# Patient Record
Sex: Female | Born: 1940 | Race: White | Hispanic: No | Marital: Married | Smoking: Never smoker
Health system: Southern US, Community
[De-identification: ages and names within clinical notes are randomized; demographics above are authoritative.]

## PROBLEM LIST (undated history)

## (undated) DIAGNOSIS — S82892A Other fracture of left lower leg, initial encounter for closed fracture: Secondary | ICD-10-CM

## (undated) DIAGNOSIS — E785 Hyperlipidemia, unspecified: Secondary | ICD-10-CM

## (undated) DIAGNOSIS — Z860100 Personal history of colon polyps, unspecified: Secondary | ICD-10-CM

## (undated) DIAGNOSIS — J189 Pneumonia, unspecified organism: Secondary | ICD-10-CM

## (undated) DIAGNOSIS — M1990 Unspecified osteoarthritis, unspecified site: Secondary | ICD-10-CM

## (undated) DIAGNOSIS — E039 Hypothyroidism, unspecified: Secondary | ICD-10-CM

## (undated) DIAGNOSIS — IMO0002 Reserved for concepts with insufficient information to code with codable children: Secondary | ICD-10-CM

## (undated) DIAGNOSIS — C801 Malignant (primary) neoplasm, unspecified: Secondary | ICD-10-CM

## (undated) DIAGNOSIS — N816 Rectocele: Secondary | ICD-10-CM

---
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
## 2000-09-21 ENCOUNTER — Ambulatory Visit (HOSPITAL_COMMUNITY): Admission: RE | Admit: 2000-09-21 | Discharge: 2000-09-21 | Payer: Self-pay | Admitting: Gastroenterology

## 2000-09-21 ENCOUNTER — Encounter (INDEPENDENT_AMBULATORY_CARE_PROVIDER_SITE_OTHER): Payer: Self-pay | Admitting: Specialist

## 2000-09-21 HISTORY — PX: COLONOSCOPY W/ POLYPECTOMY: SHX1380

## 2003-06-19 ENCOUNTER — Other Ambulatory Visit: Admission: RE | Admit: 2003-06-19 | Discharge: 2003-06-19 | Payer: Self-pay | Admitting: Family Medicine

## 2003-06-21 ENCOUNTER — Encounter (INDEPENDENT_AMBULATORY_CARE_PROVIDER_SITE_OTHER): Payer: Self-pay | Admitting: *Deleted

## 2003-06-21 ENCOUNTER — Ambulatory Visit (HOSPITAL_COMMUNITY): Admission: RE | Admit: 2003-06-21 | Discharge: 2003-06-21 | Payer: Self-pay | Admitting: Ophthalmology

## 2004-06-26 ENCOUNTER — Other Ambulatory Visit: Admission: RE | Admit: 2004-06-26 | Discharge: 2004-06-26 | Payer: Self-pay | Admitting: Family Medicine

## 2005-03-06 IMAGING — CR DG CHEST 2V
2 series · 2 of 2 positions shown · non-contrast
Comparison: none

CLINICAL DATA: Patient for vitrectomy.  Patient is a nonsmoker and has no chest complaints.  For eye surgery.   
 TWO VIEW CHEST
 PA and lateral views of the chest show the heart, lungs, bony thorax and soft tissues to be within the limits of normal.  There are no previous films for comparison. 
 IMPRESSION
 No evidence of active disease in the chest.

[view not recorded (1 of 2)]
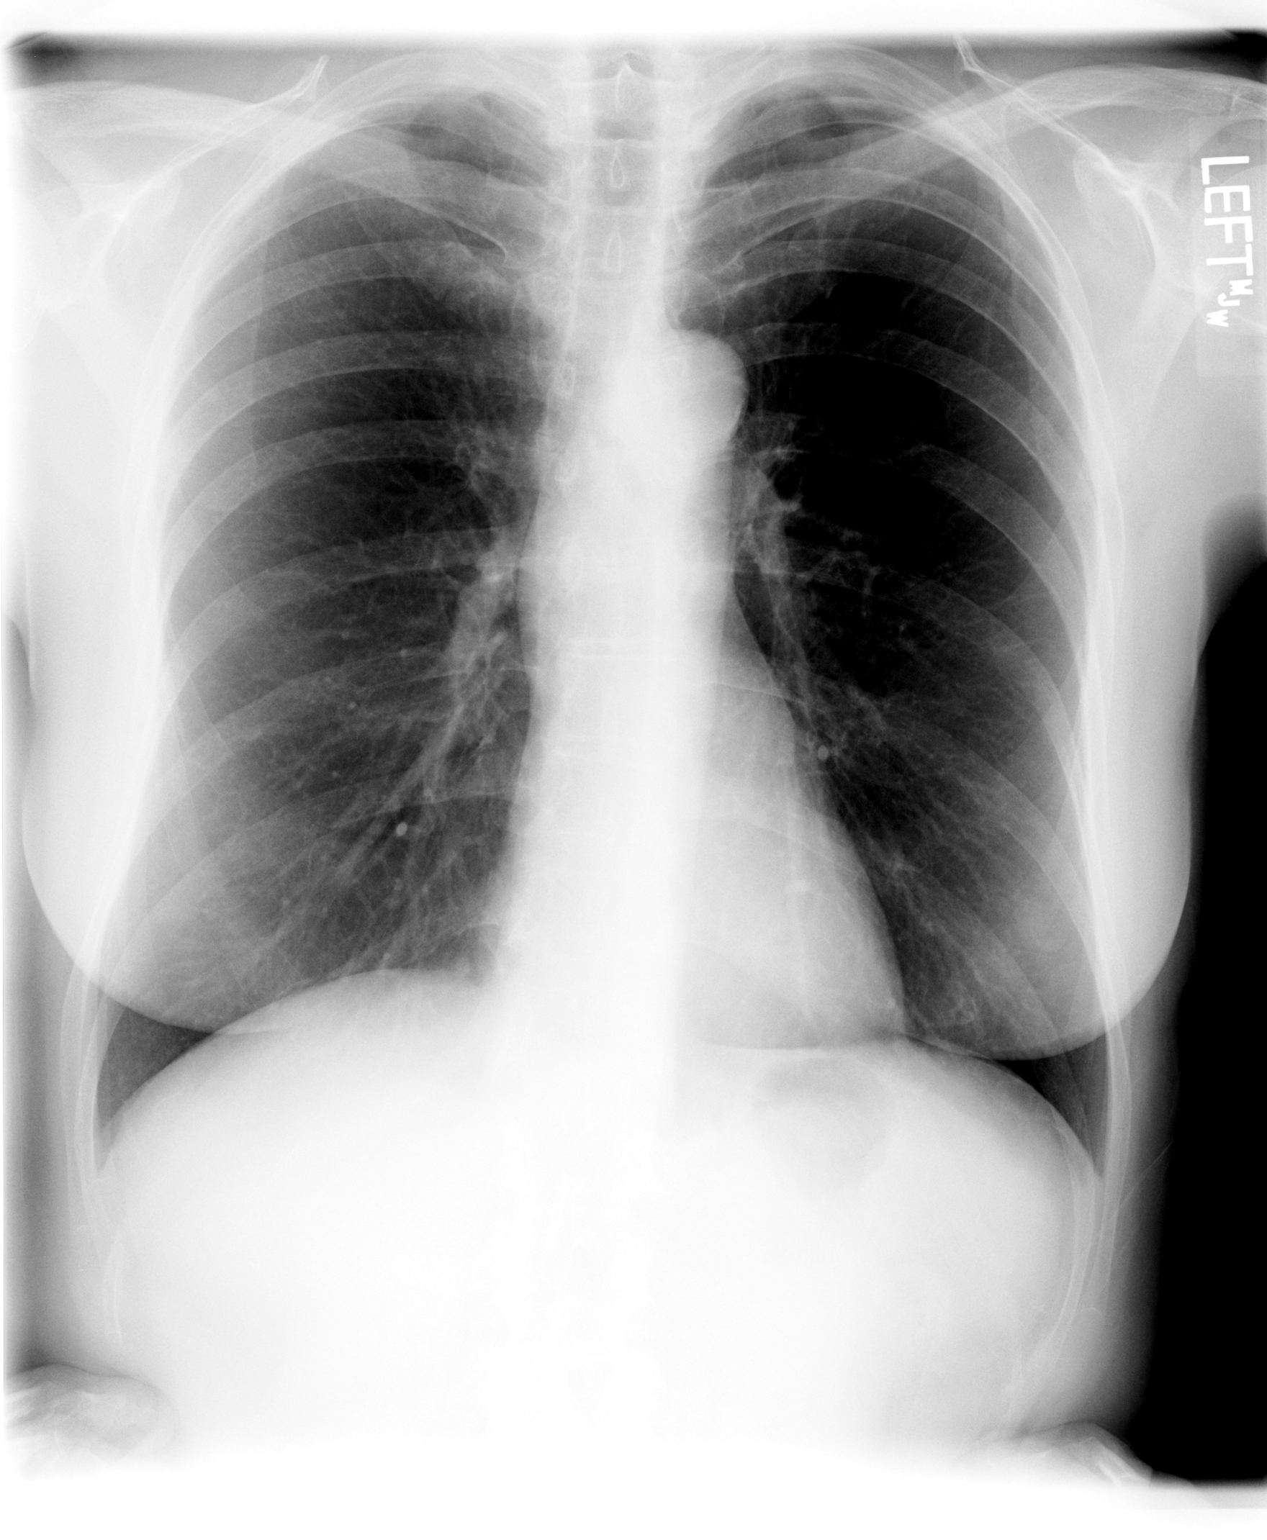

[view not recorded (2 of 2)]
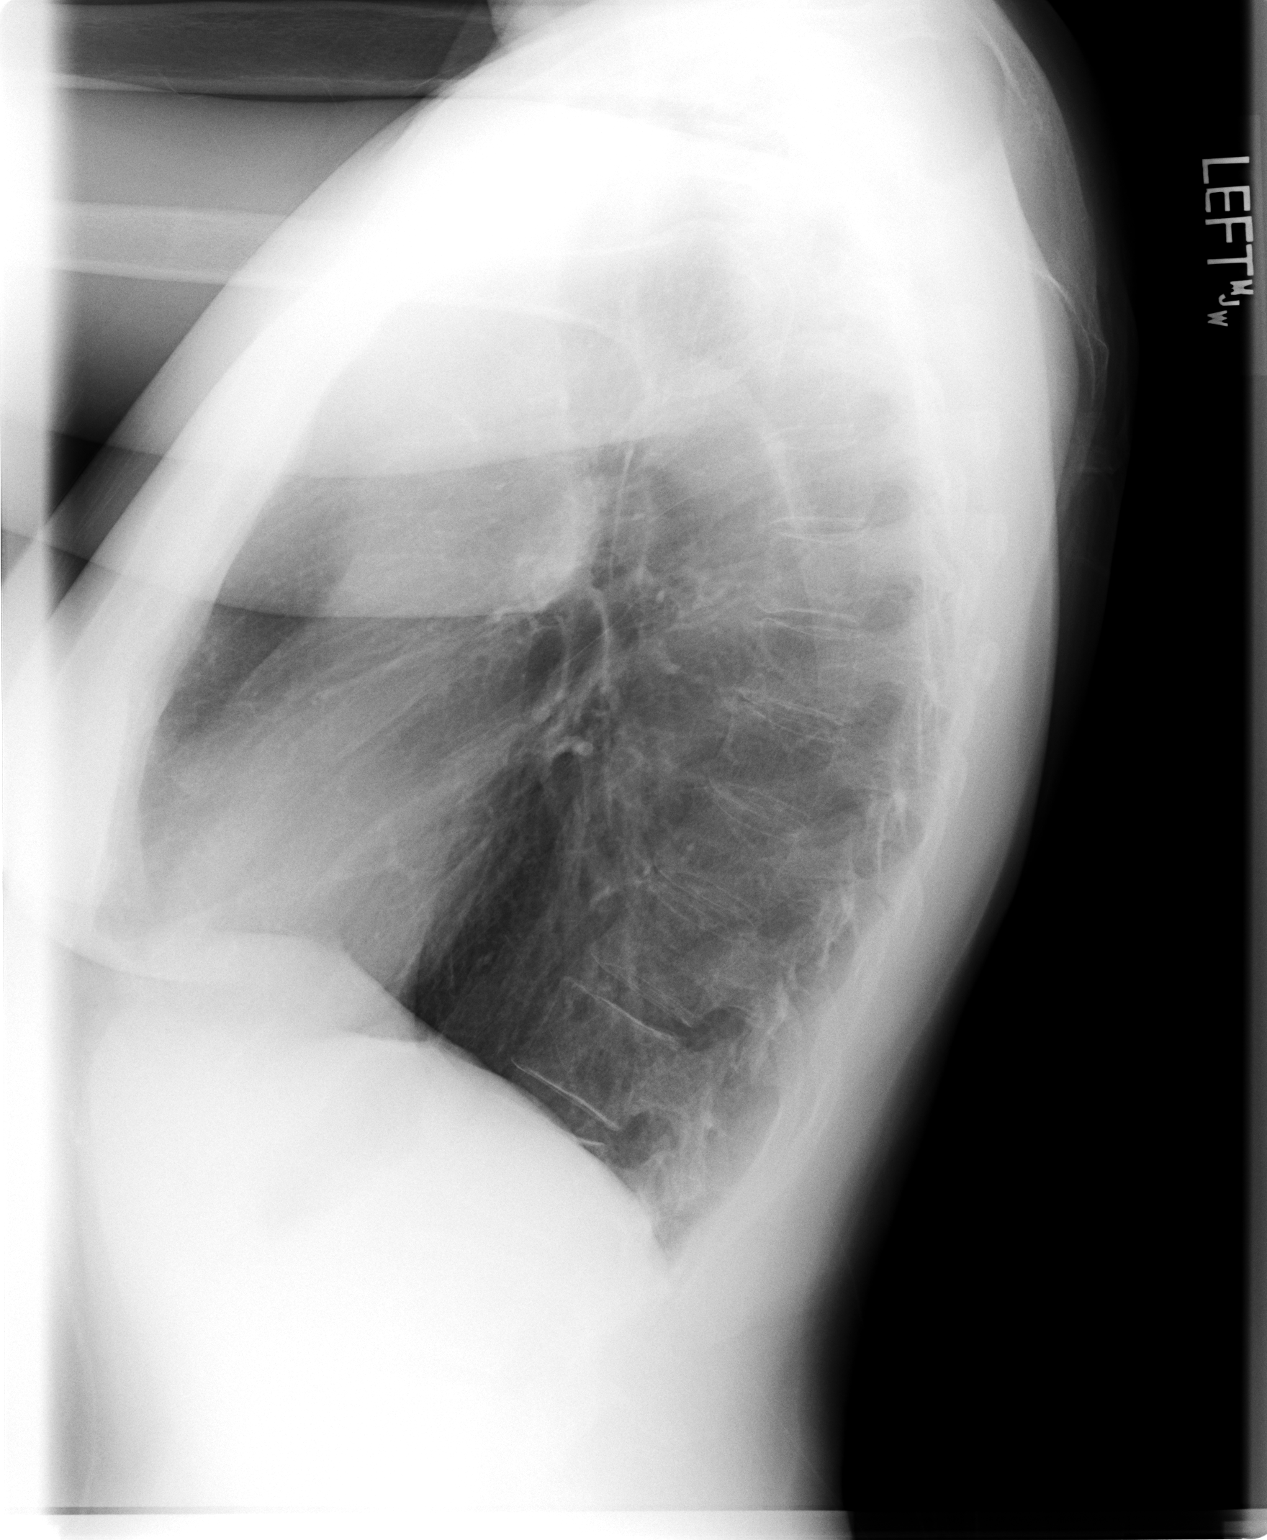

[2 of 2 positions shown; findings below may reference images not displayed]

## 2005-07-07 ENCOUNTER — Other Ambulatory Visit: Admission: RE | Admit: 2005-07-07 | Discharge: 2005-07-07 | Payer: Self-pay | Admitting: Family Medicine

## 2006-07-16 ENCOUNTER — Other Ambulatory Visit: Admission: RE | Admit: 2006-07-16 | Discharge: 2006-07-16 | Payer: Self-pay | Admitting: Family Medicine

## 2007-04-19 ENCOUNTER — Inpatient Hospital Stay (HOSPITAL_COMMUNITY): Admission: RE | Admit: 2007-04-19 | Discharge: 2007-04-22 | Payer: Self-pay | Admitting: Orthopedic Surgery

## 2007-09-20 ENCOUNTER — Other Ambulatory Visit: Admission: RE | Admit: 2007-09-20 | Discharge: 2007-09-20 | Payer: Self-pay | Admitting: Family Medicine

## 2009-01-23 ENCOUNTER — Encounter (INDEPENDENT_AMBULATORY_CARE_PROVIDER_SITE_OTHER): Payer: Self-pay | Admitting: Obstetrics and Gynecology

## 2009-01-23 ENCOUNTER — Ambulatory Visit (HOSPITAL_COMMUNITY): Admission: RE | Admit: 2009-01-23 | Discharge: 2009-01-24 | Payer: Self-pay | Admitting: Obstetrics and Gynecology

## 2009-10-14 ENCOUNTER — Encounter: Admission: RE | Admit: 2009-10-14 | Discharge: 2009-10-14 | Payer: Self-pay | Admitting: Radiology

## 2009-10-22 ENCOUNTER — Ambulatory Visit (HOSPITAL_COMMUNITY): Admission: RE | Admit: 2009-10-22 | Discharge: 2009-10-22 | Payer: Self-pay | Admitting: Surgery

## 2009-10-30 ENCOUNTER — Ambulatory Visit: Payer: Self-pay | Admitting: Oncology

## 2009-11-07 ENCOUNTER — Ambulatory Visit: Admission: RE | Admit: 2009-11-07 | Discharge: 2010-01-10 | Payer: Self-pay | Admitting: Radiation Oncology

## 2010-05-31 LAB — COMPREHENSIVE METABOLIC PANEL
AST: 24 U/L (ref 0–37)
Albumin: 4.1 g/dL (ref 3.5–5.2)
Calcium: 9.3 mg/dL (ref 8.4–10.5)
Chloride: 103 mEq/L (ref 96–112)
Creatinine, Ser: 0.71 mg/dL (ref 0.4–1.2)
GFR calc Af Amer: >60 mL/min (ref >60)
Total Protein: 7.6 g/dL (ref 6.0–8.3)

## 2010-05-31 LAB — DIFFERENTIAL
Eosinophils Relative: 0 % (ref 0–5)
Lymphocytes Relative: 29 % (ref 12–46)
Lymphs Abs: 1.5 10*3/uL (ref 0.7–4.0)
Monocytes Absolute: 0.3 10*3/uL (ref 0.1–1.0)
Monocytes Relative: 6 % (ref 3–12)

## 2010-05-31 LAB — CBC
MCH: 32.4 pg (ref 26.0–34.0)
MCHC: 33.7 g/dL (ref 30.0–36.0)
Platelets: 222 10*3/uL (ref 150–400)
RBC: 3.95 MIL/uL (ref 3.87–5.11)
RDW: 12.3 % (ref 11.5–15.5)

## 2010-05-31 LAB — URINALYSIS, ROUTINE W REFLEX MICROSCOPIC
Bilirubin Urine: NEGATIVE
Glucose, UA: NEGATIVE mg/dL
Ketones, ur: 15 mg/dL — AB
Specific Gravity, Urine: 1.019 (ref 1.005–1.030)
pH: 6.5 (ref 5.0–8.0)

## 2010-05-31 LAB — URINE MICROSCOPIC-ADD ON

## 2010-05-31 LAB — SURGICAL PCR SCREEN
MRSA, PCR: NEGATIVE
Staphylococcus aureus: NEGATIVE

## 2010-06-19 LAB — CBC
HCT: 24.4 % — ABNORMAL LOW (ref 36.0–46.0)
HCT: 35.7 % — ABNORMAL LOW (ref 36.0–46.0)
MCV: 97.9 fL (ref 78.0–100.0)
Platelets: 189 10*3/uL (ref 150–400)
Platelets: 218 10*3/uL (ref 150–400)
RDW: 12.8 % (ref 11.5–15.5)
RDW: 12.9 % (ref 11.5–15.5)
WBC: 4.5 10*3/uL (ref 4.0–10.5)
WBC: 9.3 10*3/uL (ref 4.0–10.5)

## 2010-06-19 LAB — BASIC METABOLIC PANEL
BUN: 12 mg/dL (ref 6–23)
Chloride: 104 mEq/L (ref 96–112)
Creatinine, Ser: 0.6 mg/dL (ref 0.4–1.2)
Glucose, Bld: 90 mg/dL (ref 70–99)

## 2010-07-30 NOTE — Discharge Summary (Signed)
Marilyn Crosby, Marilyn Crosby              ACCOUNT NO.:  [id number]   MEDICAL RECORD NO.:  [id number]          PATIENT TYPE:  INP   LOCATION:  1612                         FACILITY:  [facility]   PHYSICIAN:  Ollen Gross, M.D.    DATE OF BIRTH:  [birth year]   DATE OF ADMISSION:  04/19/2007  DATE OF DISCHARGE:  04/22/2007                               DISCHARGE SUMMARY   ADMITTING DIAGNOSES:  1. Bilateral knee osteoarthritis.  2. Left eye history of macular hemorrhage.  3. Left eye legally blind.  4. Hypothyroidism.  5. Osteoporosis.  6. Postmenopausal.   DISCHARGE DIAGNOSES:  1. Osteoarthritis of left knee, status post left total knee      arthroplasty.  2. Acute blood loss anemia.  Did not require transfusion.  3. Left eye history of macular hemorrhage.  4. Left eye legally blind.  5. Hypothyroidism.  6. Osteoporosis.  7. Postmenopausal.   PROCEDURE:  On April 19, 2007, left total knee surgery by Dr. Lequita Crosby,  assistant Marilyn Peace, PA-C.  Spinal anesthesia.  Consults none.   HISTORY OF PRESENT ILLNESS:  Marilyn Crosby is a 70 year old female with end-  stage arthritis of the left knee, worsening pain and dysfunction, failed  medical management and now presents for total knee.   LABORATORY DATA:  Preop CBC with hemoglobin 12.6, hematocrit 36.8, white  cell count 3.7, platelets 258.  PT/PTT preop 13.6 and 1.0, PTT of 35.  Preop UA only trace leukocyte esterase with 0-2 wbc's, 0-2 rbc's and  rare bacteria.  Chemistry panel all within normal limits.  Serial BMETs  were followed.  Hemoglobin did drop to 10.3, then 8.2, last noted 8.0  and 22.9.  Serial protimes followed with PT/INR 18.9 and 1.5.  Serial  BMETs with electrolytes remaining within normal limits.   EKG on April 02, 2007, in sinus rhythm.   HOSPITAL COURSE:  The patient was admitted to [facility] and  tolerated the procedure well and later transferred to the recovery room  and orthopedic floor.  She was started  on PC and p.o. analgesics.  She  had a spinal with Duramorph and had a little bit of itching, but that  did improve after the Duramorph wore off.  Medications were restarted.  Hemoglobin was 10 postop.  She started getting up out of bed.  By day  #2, she was doing a little bit better, but she did have some  intermittent nausea from the evening of day #1, to the morning of day  #2.  She was tolerating the CPM well.  Hemoglobin was down to 8.2 and  felt to be a little bit of a dilutional component.  She denied any  lightheadedness or dizziness.  She actually did well with her therapy  with low hemoglobin and walked about 140 feet.  Dressing was changed and  incision looked good.  By day #3, hemoglobin is stabilized at 8.0.  She  was asymptomatic with this, progressing well with therapy and wanted to  go home.   DISPOSITION:  The patient was discharged to home on April 22, 2007.   DISCHARGE MEDICATIONS:  Percocet, Robaxin, Nu-Iron and Coumadin.   DIET:  Resume home diet.   ACTIVITY:  She is weightbearing as tolerated on the left lower  extremity.  Home health PT with home health nurse.  Total knee protocol.   FOLLOW UP:  Follow up in 2 weeks.   DISPOSITION:  Home.   CONDITION ON DISCHARGE:  Improved.      Marilyn Crosby, P.A.C.      Ollen Gross, M.D.  Electronically Signed    ALP/MEDQ  D:  04/22/2007  T:  04/23/2007  Job:  [id number]

## 2010-07-30 NOTE — H&P (Signed)
NAMEASTRA, Marilyn Crosby NO.:  [id number]   MEDICAL RECORD NO.:  [id number]          PATIENT TYPE:  INP   LOCATION:  1612                         FACILITY:  [facility]   PHYSICIAN:  Ollen Gross, M.D.    DATE OF BIRTH:  [birth year]   DATE OF ADMISSION:  04/19/2007  DATE OF DISCHARGE:                              HISTORY & PHYSICAL   Date of office visit and history and physical performed on April 08, 2007.  Date of admission was April 19, 2007.   CHIEF COMPLAINT:  Bilateral knee pain, left greater than right.   HISTORY OF PRESENT ILLNESS:  The patient is a 69 year old female who has  been seen by Dr. Lequita Halt for ongoing bilateral knee pain that has been  ongoing for quite some  now.  She was seen as a second opinion.  She has  been treated in the past by Dr. Fredric Mare for bilateral knee  osteoarthritis.  She has undergone cortisone and also Supartz  injections.  She continued to have pain.  It is felt she has reached a  point where she would benefit from undergoing surgical intervention.  Risks and benefits discussed.  She elects to proceed to undergo a left  knee replacement.   ALLERGIES:  NO KNOWN DRUG ALLERGIES.   CURRENT MEDICATIONS:  Synthroid, alendronate, eye caps, biotin,  vitamins, calcium with D, fish oil.   PAST MEDICAL HISTORY:  Cataracts, left eye macular hemorrhage about 25  years ago with decreased vision in the left eye, where she is legally  blind, hypothyroidism, osteoporosis, postmenopausal.   PAST SURGICAL HISTORY:  Tonsils, adenoids in 1947, ganglion cyst in  1969, deviated septum,  septoplasty in 1975, macular hemorrhage surgery,  left eye 1983, bunion surgeries in 1995 and 1996, cataract surgeries in  1995 and 2005.   SOCIAL HISTORY:  Married, retired.  Nonsmoker.  No alcohol.  Family will  be assisting with care after surgery.   FAMILY HISTORY:  Father deceased age 38 with septic shock following  pneumonia.  Mother living, age 96, with  macular degeneration.  Brother  age 64 with prostate cancer.  Two children.   REVIEW OF SYSTEMS:  GENERAL:  No fevers, chills, night sweats.  NEUROLOGIC:  No seizures, syncope or paralysis.  RESPIRATORY:  No  shortness of breath, productive cough or hemoptysis.  CARDIOVASCULAR:  No chest pain, orthopnea.  GASTROINTESTINAL:  No nausea, vomiting,  diarrhea or constipation.  GENITOURINARY:  No dysuria or hematuria or  discharge.  MUSCULOSKELETAL:  Left knee.   PHYSICAL EXAMINATION:  VITAL SIGNS:  Pulse 68, respirations 12, blood  pressure 142/78.  GENERAL:  A 70 year old white female, tall, slender  frame, in no acute distress.  She is alert, oriented, cooperative,  pleasant, an excellent historian.  HEENT:  Normocephalic, atraumatic.  Pupils are round and reactive.  Oropharynx clear.  EOMs intact.  NECK:  Supple.  CHEST:  Clear.  HEART:  Regular rate and rhythm.  No murmur.  S1 and S2 noted.  ABDOMEN:  Soft, nontender.  Bowel sounds present.  BREASTS/GENITALIA:  Not done, not pertinent.  EXTREMITIES:  Right knee shows range of motion from 5 to 135, marked  crepitus, no instability.  Left knee shows range of motion 5 to 135,  marked crepitus, no instability.   IMPRESSION:  1. Bilateral knee osteoarthritis.  2. Left eye history of macular hemorrhage.  3. Legally blind, left eye.  4. Hypothyroidism.  5. Osteoporosis.  6. Postmenopausal.   PLAN:  The patient admitted to [facility] to undergo a left  total knee replacement arthroplasty.  Surgery will be performed by Dr.  Ollen Gross.  She has been seen preoperatively by Dr. Merri Brunette,  felt to be stable for upcoming surgery.      Alexzandrew L. Perkins, P.A.C.      Ollen Gross, M.D.  Electronically Signed    ALP/MEDQ  D:  04/20/2007  T:  04/20/2007  Job:  [id number]   cc:   Ollen Gross, M.D.  Fax: [phone]   Dario Guardian, M.D.  Fax: [phone]

## 2010-07-30 NOTE — Op Note (Signed)
Marilyn Crosby, Marilyn Crosby              ACCOUNT NO.:  [id number]   MEDICAL RECORD NO.:  [id number]          PATIENT TYPE:  INP   LOCATION:  0004                         FACILITY:  [facility]   PHYSICIAN:  Ollen Gross, M.D.    DATE OF BIRTH:  07-30-1940   DATE OF PROCEDURE:  04/19/2007  DATE OF DISCHARGE:                               OPERATIVE REPORT   PREOPERATIVE DIAGNOSES:  Osteoarthritis of the left knee.   POSTOPERATIVE DIAGNOSES:  Osteoarthritis of the left knee.   PROCEDURE:  Left total knee arthroplasty.   SURGEON:  Dr. Homero Fellers Aluisio.   ASSISTANT:  Avel Peace, PA-C.   ANESTHESIA:  Spinal.   ESTIMATED BLOOD LOSS:  Minimal.   DRAIN:  None.   TOURNIQUET TIME:  31 minutes at 300 mmHg.   COMPLICATIONS:  None.   CONDITION:  Stable to recovery.   CLINICAL NOTE:  Marilyn Crosby is a 70 year old female with end-stage  arthritis of the left knee with progressively worsening pain and  dysfunction.  She has failed nonoperative management and presents for  total knee arthroplasty.   PROCEDURE IN DETAIL:  After successful administration of spinal  anesthetic, a tourniquet is placed high on her left thigh and her left  lower extremity is prepped and draped in the usual sterile fashion.  The  extremity is wrapped in Esmarch, knee flexed, tourniquet inflated to 300  mmHg.  A midline incision was made with a 10 blade through the  subcutaneous tissue to the level of the extensor mechanism.  A fresh  blade is used make a medial parapatellar arthrotomy.  The soft tissue  over the proximal medial tibia is subperiosteally elevated to the joint  line with the knife and into the semimembranosus bursa with a Cobb  elevator.  The soft tissue laterally is elevated with attention being  paid to avoiding the patellar tendon on the tibial tubercle.  The  patella is subluxed laterally, knee flexed 90 degrees, ACL and PCL  removed.  A drill was used create a starting hole in the distal femur  and the  canal was thoroughly irrigated.  The 5 degree left valgus  alignment guide is placed referencing off the posterior condyles,  rotation is marked and a block pinned to remove 10 mm off the distal  femur.  Distal femoral resection is made with an oscillating saw.  A  sizing block is placed, size three is the most appropriate.  Rotation is  marked off the epicondylar axis.  A size three cutting block is placed  and the anterior-posterior and chamfer cuts made.   The tibia is subluxed forward, menisci removed.  The extramedullary  tibial alignment guide is placed referencing proximally of the medial  aspect of the tibial tubercle and distally along the second metatarsal  axis and tibial crest.  The block is pinned to remove 10 mm off the non  deficient lateral side. Tibial resection is made with an oscillating  saw.  A size three is the most appropriate tibial component and the  proximal tibia is prepared with a modular drill and keel punch for a  size three.  Femoral preparation is completed with the intercondylar  cut.   A size three mobile bearing tibial trial, size three posterior  stabilized femoral trial and a 10 mm posterior stabilized rotating  platform insert trial are placed.  With the 10, she slightly  hyperextends so I went to a 12.5 which allowed full extension with  excellent varus,  valgus,  anterior and posterior balance throughout  full range of motion. The patella was then everted and the thickness  measured to be 21 mm.  Freehand resection was taken to 12 mm, 35  templates placed, lug holes were drilled, trial patella is placed and it  tracks normally.  The osteophytes are removed off the posterior femur  with the trial in place.  All trials are removed and the cut bone  surfaces prepared with pulsatile lavage.  Cement was mixed and once  ready for implantation the size three mobile bearing tibial tray, size  three posterior stabilized femur, and 35 patella are cemented  into  place. The patella was held with the clamp. The trial 12.5-mm insert is  placed, knee held in full extension and all extruded cement removed.  Once the cement is fully hardened then the trial is removed, the joint  thoroughly irrigated with saline solution and FloSeal injected on the  posterior capsule.  The permanent 12.5 mm posterior stabilized rotating  platform insert is placed into the tibial tray.  FloSeal is then  injected in the medial and lateral gutters and suprapatellar area.  A  moist sponge is placed and tourniquet released for a total time of 31  minutes.  The sponge is held for 2 minutes and then removed. Minimal  bleeding is encountered.  The bleeding that is encountered is stopped  with electrocautery.  The wound is further irrigated and the extensor  mechanism closed with interrupted #1 PDS.  Flexion against gravity is  135 degrees.  The subcu is closed with interrupted 2-0 Vicryl and  subcuticular with running 4-0 Monocryl.  The incision is cleaned and  dried and Steri-Strips and a bulky sterile dressing applied.  She is  then placed into a knee immobilizer, awakened and transported to  recovery in stable condition.      Ollen Gross, M.D.  Electronically Signed     FA/MEDQ  D:  04/19/2007  T:  04/19/2007  Job:  [id number]

## 2010-08-02 NOTE — Procedures (Signed)
[place]  Patient:    Marilyn Crosby, Marilyn Crosby                       MRN: [id number] Proc. Date: 09/21/00 Adm. Date:  [id number] Attending:  Rich Brave CC:         Dario Guardian, M.D.   Procedure Report  PROCEDURE:  Colonoscopy with polypectomy.  COLONOSCOPIST:  Florencia Reasons, M.D.  INDICATIONS:  Family history of colon cancer in a 70 year old female.  FINDINGS:  Small 4 mm sessile polyp snared at 15 cm from the anal verge, minimal sigmoid diverticulosis.  PROCEDURE:  The nature and purpose and risks of the procedure have been discussed with the patient who provided written consent.  Sedation was Fentanyl 75 mcg and Versed 7.5 mg IV without arrhythmias or desaturation.  The Olympus adjustable tension pediatric video colonoscope was advanced to the terminal ileum and pullback was then performed.  The quality of the prep was excellent and it was felt that all areas were well seen.  There was a 4 mm sessile polyp snared at about 15 cm from the external anal opening.  It was retrieved by suctioning through the scope.  There was minimal sigmoid diverticulosis.  This was an otherwise normal examination without evidence of large polyps, cancer, colitis, or vascular malformation.  Retroflexion of the rectum following the polypectomy was unremarkable, with just a small hyperplastic appearing polyp that really was not readily visible on antegrade viewing.  The patient tolerated the procedure well and there were no apparent complications.  IMPRESSION: 1. Rectal polyp removed as described above. 2. Minimal sigmoid diverticulosis. 3. Family history of colon cancer.  PLAN:  Followup colonoscopy in five years because of the family history of colon cancer, regardless of the histology of the polyp. DD:  09/21/00 TD:  09/21/00 Job: [id number]

## 2010-10-10 ENCOUNTER — Other Ambulatory Visit: Payer: Self-pay | Admitting: Gastroenterology

## 2010-10-22 ENCOUNTER — Encounter (INDEPENDENT_AMBULATORY_CARE_PROVIDER_SITE_OTHER): Payer: Self-pay | Admitting: Surgery

## 2010-12-05 LAB — COMPREHENSIVE METABOLIC PANEL
ALT: 15
Alkaline Phosphatase: 45
BUN: 11
CO2: 30
Chloride: 105
GFR calc non Af Amer: >60
Glucose, Bld: 100 — ABNORMAL HIGH
Potassium: 4.7
Sodium: 141
Total Bilirubin: 0.9
Total Protein: 6.8

## 2010-12-05 LAB — URINALYSIS, ROUTINE W REFLEX MICROSCOPIC
Ketones, ur: NEGATIVE
Nitrite: NEGATIVE
Protein, ur: NEGATIVE
Urobilinogen, UA: 0.2

## 2010-12-05 LAB — CBC
HCT: 36.8
Hemoglobin: 12.6
RBC: 3.89
RDW: 12.7
WBC: 3.7 — ABNORMAL LOW

## 2010-12-05 LAB — TYPE AND SCREEN: ABO/RH(D): A POS

## 2010-12-05 LAB — PROTIME-INR
INR: 1
Prothrombin Time: 13.8

## 2010-12-05 LAB — URINE MICROSCOPIC-ADD ON

## 2010-12-06 LAB — CBC
HCT: 24.5 — ABNORMAL LOW
HCT: 30 — ABNORMAL LOW
Hemoglobin: 8.2 — ABNORMAL LOW
MCHC: 34.5
MCHC: 34.6
MCV: 93.1
MCV: 94.2
Platelets: 146 — ABNORMAL LOW
Platelets: 161
Platelets: 180
Platelets: 199
RDW: 12.2
RDW: 12.2
RDW: 12.4
RDW: 12.7
WBC: 5.3

## 2010-12-06 LAB — BASIC METABOLIC PANEL
BUN: 4 — ABNORMAL LOW
BUN: 4 — ABNORMAL LOW
CO2: 28
CO2: 31
Calcium: 8.2 — ABNORMAL LOW
Chloride: 102
Creatinine, Ser: 0.59
Glucose, Bld: 101 — ABNORMAL HIGH
Glucose, Bld: 122 — ABNORMAL HIGH
Potassium: 3.6
Sodium: 138

## 2010-12-06 LAB — PROTIME-INR
INR: 1.5
INR: 2 — ABNORMAL HIGH

## 2011-03-22 ENCOUNTER — Other Ambulatory Visit: Payer: Self-pay | Admitting: Orthopedic Surgery

## 2011-03-22 NOTE — H&P (Signed)
Edwin Cap  DOB: [birth year] Married /  Race: White / Female  Date Of Admission:  04/07/2011  Chief Complaint:  Right Knee Pain  History of Present Illness The patient is a 71 year old female who comes in today for a preoperative History and Physical. The patient is scheduled for a right total knee arthroplasty to be performed by Dr. Gus Rankin. Aluisio, MD at [facility] on 04/07/2011. They have been treated conservatively in the past for the above stated problem and despite conservative measures, they continue to have progressive pain and severe functional limitations and dysfunction. They have failed non-operative management including home exercise, medications, and injections. It is felt that they would benefit from undergoing total joint replacement. Risks and benefits of the procedure have been discussed with the patient and they elect to proceed with surgery. There are no active contraindications to surgery such as ongoing infection or rapidly progressive neurological disease.  Allergies Morphine Derivatives - Itching.  Medication List Synthroid ( Oral) Specific dose unknown - Active. Aspirin Childrens (81MG  Tablet Chewable, Oral daily) Active.  Problem List/Past Medical Osteoarthritis Breast Cancer Hypothyroidism  Past Surgical History Breast Biopsy. Date: 2011. left Breast Mass; Local Excision. Date: 2011. Left Lumpectomy Cataract Surgery. bilateral in 1995, and right again in 2005 Colon Polyp Removal - Colonoscopy. Date: 09/2010. Hysterectomy. complete (non-cancerous) Straighten Nasal Septum. Date: 45. Total Knee Replacement. Date: 2009. left Tonsillectomy. Date: 55. Bunionectomy. 1995 and 1996 Left Eye Laser Surgery for Macular Hemorrhage. Date: 10.  Family History Cancer. father Father. Deceased. age 68, septic shock following a pneumonia Mother. Deceased. age 55, macular degeneration, dementia Brother 1. Prostate Cancer.  Social  History Tobacco use. never smoker Alcohol use. never consumed alcohol Marital status. married Living situation. live with spouse Current work status. retired Copywriter, advertising. 2  Review of Systems General:Not Present- Chills, Fever, Night Sweats, Fatigue, Weight Gain, Weight Loss and Memory Loss. Skin:Not Present- Hives, Itching, Rash, Eczema and Lesions. HEENT:Not Present- Tinnitus, Headache, Double Vision, Visual Loss, Hearing Loss and Dentures. Respiratory:Present- Cough (dry, nonproductive). Not Present- Shortness of breath with exertion, Shortness of breath at rest, Allergies and Coughing up blood. Cardiovascular:Not Present- Chest Pain, Racing/skipping heartbeats, Difficulty Breathing Lying Down, Murmur, Swelling and Palpitations. Gastrointestinal:Not Present- Bloody Stool, Heartburn, Abdominal Pain, Vomiting, Nausea, Constipation, Diarrhea, Difficulty Swallowing, Jaundice and Loss of appetitie. Female Genitourinary:Not Present- Blood in Urine, Urinary frequency, Weak urinary stream, Discharge, Flank Pain, Incontinence, Painful Urination, Urgency, Urinary Retention and Urinating at Night. Musculoskeletal:Present- Joint Pain. Not Present- Muscle Weakness, Muscle Pain, Joint Swelling, Back Pain, Morning Stiffness and Spasms. Neurological:Not Present- Tremor, Dizziness, Blackout spells, Paralysis, Difficulty with balance and Weakness. Psychiatric:Not Present- Insomnia.  Vitals Weight: 145 lb Height: 65 in Body Surface Area: 1.74 m Body Mass Index: 24.13 kg/m Pulse: 76 (Regular) Resp.: 14 (Unlabored) BP: 152/80 (Sitting, Right Arm, Standard)  Physical Exam The physical exam findings are as follows: Patient is a 71 year old female with continued right knee pain.  General Mental Status - Alert, cooperative and good historian. General Appearance- pleasant. Not in acute distress. Orientation- Oriented X3. Build & Nutrition- Well nourished and Well  developed.  Head and Neck Head- normocephalic, atraumatic . Neck Global Assessment- supple. no bruit auscultated on the right and no bruit auscultated on the left.  Eye Pupil- Bilateral- Regular and Round. Motion- Bilateral- EOMI. wears glasses  Chest and Lung Exam Auscultation: Breath sounds:- clear at anterior chest wall and - clear at posterior chest wall. Adventitious sounds:- No Adventitious sounds.  Cardiovascular Auscultation:Rhythm- Regular rate and rhythm. Heart Sounds- S1 WNL and S2 WNL. Murmurs & Other Heart Sounds:Auscultation of the heart reveals - No Murmurs.  Abdomen Palpation/Percussion:Tenderness- Abdomen is non-tender to palpation. Rigidity (guarding)- Abdomen is soft. Auscultation:Auscultation of the abdomen reveals - Bowel sounds normal.  Female Genitourinary Not done, not pertinent to present illness  Musculoskeletal Left knee looks great. There is no swelling. Range is 0 to 125. No tenderness or instability. Right knee shows no effusion. There is marked crepitus on range of motion. Range is about 10 to 120. She is tender medial greater than lateral. There is no instability noted about the right knee. Gait pattern is antalgic.  RADIOGRAPHS: Reviewed of AP of both knees and lateral. Her prosthesis on the left is in excellent position with no periprosthetic abnormalities. Right knee shows bone on bone arthritis in the medial and patellofemoral compartments. She has varus deformity of about 10 degrees. She has some subluxation of the tibia and the femur. She has significant periarticular osteophytes.  Assessment & Plan Osteoarthritis Right Knee  Patient is for a Right Total Knee Replacement by Dr. Ollen Gross.  Plan is to go home following the procedure.  Avel Peace, PA-C

## 2011-03-25 ENCOUNTER — Encounter (HOSPITAL_COMMUNITY): Payer: Self-pay | Admitting: Pharmacy Technician

## 2011-03-31 ENCOUNTER — Encounter (HOSPITAL_COMMUNITY): Payer: Self-pay

## 2011-03-31 ENCOUNTER — Encounter (HOSPITAL_COMMUNITY)
Admission: RE | Admit: 2011-03-31 | Discharge: 2011-03-31 | Disposition: A | Discharge status: Home / Self Care | Payer: Medicare Other | Source: Ambulatory Visit | Attending: Orthopedic Surgery | Admitting: Orthopedic Surgery

## 2011-03-31 DIAGNOSIS — C801 Malignant (primary) neoplasm, unspecified: Secondary | ICD-10-CM

## 2011-03-31 DIAGNOSIS — M1990 Unspecified osteoarthritis, unspecified site: Secondary | ICD-10-CM

## 2011-03-31 DIAGNOSIS — S82892A Other fracture of left lower leg, initial encounter for closed fracture: Secondary | ICD-10-CM

## 2011-03-31 HISTORY — PX: NASAL SEPTUM SURGERY: SHX37

## 2011-03-31 HISTORY — DX: Unspecified osteoarthritis, unspecified site: M19.90

## 2011-03-31 HISTORY — PX: BREAST SURGERY: SHX581

## 2011-03-31 HISTORY — PX: HAMMER TOE SURGERY: SHX385

## 2011-03-31 HISTORY — PX: BUNIONECTOMY: SHX129

## 2011-03-31 HISTORY — PX: GANGLION CYST EXCISION: SHX1691

## 2011-03-31 HISTORY — PX: ABDOMINAL HYSTERECTOMY: SHX81

## 2011-03-31 HISTORY — DX: Other fracture of left lower leg, initial encounter for closed fracture: S82.892A

## 2011-03-31 HISTORY — PX: JOINT REPLACEMENT: SHX530

## 2011-03-31 HISTORY — DX: Malignant (primary) neoplasm, unspecified: C80.1

## 2011-03-31 HISTORY — PX: CATARACT EXTRACTION W/ INTRAOCULAR LENS IMPLANT: SHX1309

## 2011-03-31 LAB — COMPREHENSIVE METABOLIC PANEL
ALT: 15 U/L (ref 0–35)
AST: 20 U/L (ref 0–37)
Alkaline Phosphatase: 42 U/L (ref 39–117)
CO2: 28 mEq/L (ref 19–32)
Chloride: 104 mEq/L (ref 96–112)
GFR calc non Af Amer: 87 mL/min — ABNORMAL LOW (ref >90)
Sodium: 141 mEq/L (ref 135–145)
Total Bilirubin: 0.3 mg/dL (ref 0.3–1.2)

## 2011-03-31 LAB — URINALYSIS, ROUTINE W REFLEX MICROSCOPIC
Bilirubin Urine: NEGATIVE
Protein, ur: NEGATIVE mg/dL
Urobilinogen, UA: 0.2 mg/dL (ref 0.0–1.0)

## 2011-03-31 LAB — CBC
MCV: 97.8 fL (ref 78.0–100.0)
Platelets: 233 10*3/uL (ref 150–400)
RBC: 3.71 MIL/uL — ABNORMAL LOW (ref 3.87–5.11)
WBC: 5 10*3/uL (ref 4.0–10.5)

## 2011-03-31 LAB — PROTIME-INR
INR: 1.06 (ref 0.00–1.49)
Prothrombin Time: 14 seconds (ref 11.6–15.2)

## 2011-03-31 LAB — URINE MICROSCOPIC-ADD ON

## 2011-03-31 MED ORDER — CHLORHEXIDINE GLUCONATE 4 % EX LIQD: 60.0000 mL | Freq: Once | CUTANEOUS | Status: DC

## 2011-03-31 NOTE — Patient Instructions (Signed)
[address]ILL[address]  03/31/2011   Your procedure is scheduled on: 04-07-11  Report to [facility] at 0515 AM.  Call this number if you have problems the morning of surgery: [phone]   Remember:   Do not eat food:After Midnight.  May have clear liquids:until Midnight .  Clear liquids include soda, tea, black coffee, apple or grape juice, broth.  Take these medicines the morning of surgery with A SIP OF WATER: none   Do not wear jewelry, make-up or nail polish.  Do not wear lotions, powders, or perfumes. You may wear deodorant.  Do not shave 48 hours prior to surgery.  Do not bring valuables to the hospital.  Contacts, dentures or bridgework may not be worn into surgery.  Leave suitcase in the car. After surgery it may be brought to your room.  For patients admitted to the hospital, checkout time is 11:00 AM the day of discharge.   Patients discharged the day of surgery will not be allowed to drive home.  Name and phone number of your driver: spouse  Special Instructions: Incentive Spirometry - Practice and bring it with you on the day of surgery. and CHG Shower Use Special Wash: 1/2 bottle night before surgery and 1/2 bottle morning of surgery.   Please read over the following fact sheets that you were given: Blood Transfusion Information and MRSA Information

## 2011-03-31 NOTE — Pre-Procedure Instructions (Signed)
03-31-11 EKG 02-24-11 with chart. No Chest x-ray required.

## 2011-04-07 ENCOUNTER — Encounter (HOSPITAL_COMMUNITY): Payer: Self-pay | Admitting: Anesthesiology

## 2011-04-07 ENCOUNTER — Inpatient Hospital Stay (HOSPITAL_COMMUNITY)
Admission: RE | Admit: 2011-04-07 | Discharge: 2011-04-09 | DRG: 470 | Disposition: A | Discharge status: Home Health | Payer: Medicare Other | Source: Ambulatory Visit | Attending: Orthopedic Surgery | Admitting: Orthopedic Surgery

## 2011-04-07 ENCOUNTER — Inpatient Hospital Stay (HOSPITAL_COMMUNITY): Payer: Medicare Other | Admitting: Anesthesiology

## 2011-04-07 ENCOUNTER — Encounter (HOSPITAL_COMMUNITY): Payer: Self-pay

## 2011-04-07 ENCOUNTER — Encounter (HOSPITAL_COMMUNITY): Payer: Self-pay | Admitting: *Deleted

## 2011-04-07 ENCOUNTER — Encounter (HOSPITAL_COMMUNITY)
Admission: RE | Disposition: A | Discharge status: Home Health | Payer: Self-pay | Source: Ambulatory Visit | Attending: Orthopedic Surgery

## 2011-04-07 DIAGNOSIS — Z885 Allergy status to narcotic agent status: Secondary | ICD-10-CM

## 2011-04-07 DIAGNOSIS — IMO0002 Reserved for concepts with insufficient information to code with codable children: Principal | ICD-10-CM | POA: Diagnosis present

## 2011-04-07 DIAGNOSIS — M1710 Unilateral primary osteoarthritis, unspecified knee: Secondary | ICD-10-CM | POA: Diagnosis present

## 2011-04-07 DIAGNOSIS — D62 Acute posthemorrhagic anemia: Secondary | ICD-10-CM | POA: Diagnosis not present

## 2011-04-07 DIAGNOSIS — R269 Unspecified abnormalities of gait and mobility: Secondary | ICD-10-CM | POA: Diagnosis present

## 2011-04-07 DIAGNOSIS — Z01812 Encounter for preprocedural laboratory examination: Secondary | ICD-10-CM

## 2011-04-07 DIAGNOSIS — E876 Hypokalemia: Secondary | ICD-10-CM | POA: Diagnosis not present

## 2011-04-07 DIAGNOSIS — Z96659 Presence of unspecified artificial knee joint: Secondary | ICD-10-CM

## 2011-04-07 DIAGNOSIS — M179 Osteoarthritis of knee, unspecified: Secondary | ICD-10-CM | POA: Diagnosis present

## 2011-04-07 DIAGNOSIS — M21169 Varus deformity, not elsewhere classified, unspecified knee: Secondary | ICD-10-CM | POA: Diagnosis present

## 2011-04-07 DIAGNOSIS — E039 Hypothyroidism, unspecified: Secondary | ICD-10-CM | POA: Diagnosis present

## 2011-04-07 DIAGNOSIS — Z853 Personal history of malignant neoplasm of breast: Secondary | ICD-10-CM

## 2011-04-07 DIAGNOSIS — E871 Hypo-osmolality and hyponatremia: Secondary | ICD-10-CM | POA: Diagnosis not present

## 2011-04-07 DIAGNOSIS — Z79899 Other long term (current) drug therapy: Secondary | ICD-10-CM

## 2011-04-07 HISTORY — DX: Pneumonia, unspecified organism: J18.9

## 2011-04-07 HISTORY — PX: TOTAL KNEE ARTHROPLASTY: SHX125

## 2011-04-07 HISTORY — DX: Reserved for concepts with insufficient information to code with codable children: IMO0002

## 2011-04-07 HISTORY — DX: Personal history of colon polyps, unspecified: Z86.0100

## 2011-04-07 HISTORY — DX: Rectocele: N81.6

## 2011-04-07 HISTORY — DX: Personal history of colonic polyps: Z86.010

## 2011-04-07 LAB — TYPE AND SCREEN
ABO/RH(D): A POS
Antibody Screen: NEGATIVE

## 2011-04-07 SURGERY — ARTHROPLASTY, KNEE, TOTAL
Anesthesia: Spinal | Site: Knee | Laterality: Right | Wound class: Clean

## 2011-04-07 MED ORDER — LEVOTHYROXINE SODIUM 112 MCG PO TABS
112.0000 ug | ORAL_TABLET | Freq: Every day | ORAL | Status: DC
  Administered 2011-04-07 – 2011-04-08 (×2): 112 ug via ORAL
  Filled 2011-04-07 (×3): qty 1

## 2011-04-07 MED ORDER — DOCUSATE SODIUM 100 MG PO CAPS
100.0000 mg | ORAL_CAPSULE | Freq: Two times a day (BID) | ORAL | Status: DC
  Administered 2011-04-07 – 2011-04-09 (×5): 100 mg via ORAL
  Filled 2011-04-07 (×6): qty 1

## 2011-04-07 MED ORDER — MIDAZOLAM HCL 5 MG/5ML IJ SOLN
INTRAMUSCULAR | Status: DC | PRN
  Administered 2011-04-07: 2 mg via INTRAVENOUS

## 2011-04-07 MED ORDER — SODIUM CHLORIDE 0.9 % IR SOLN
Status: DC | PRN
  Administered 2011-04-07: 1000 mL

## 2011-04-07 MED ORDER — ACETAMINOPHEN 650 MG RE SUPP: 650.0000 mg | Freq: Four times a day (QID) | RECTAL | Status: DC | PRN

## 2011-04-07 MED ORDER — METHOCARBAMOL 100 MG/ML IJ SOLN
500.0000 mg | Freq: Four times a day (QID) | INTRAVENOUS | Status: DC | PRN
  Administered 2011-04-07 – 2011-04-08 (×3): 500 mg via INTRAVENOUS
  Filled 2011-04-07 (×3): qty 5

## 2011-04-07 MED ORDER — PROPOFOL 10 MG/ML IV EMUL
INTRAVENOUS | Status: DC | PRN
  Administered 2011-04-07: 120 ug/kg/min via INTRAVENOUS

## 2011-04-07 MED ORDER — BUPIVACAINE 0.25 % ON-Q PUMP SINGLE CATH 300ML
300.0000 mL | INJECTION | Status: DC
  Filled 2011-04-07: qty 300

## 2011-04-07 MED ORDER — BUPIVACAINE ON-Q PAIN PUMP (FOR ORDER SET NO CHG)
INJECTION | Status: DC
  Filled 2011-04-07: qty 1

## 2011-04-07 MED ORDER — METHOCARBAMOL 500 MG PO TABS
500.0000 mg | ORAL_TABLET | Freq: Four times a day (QID) | ORAL | Status: DC | PRN
  Administered 2011-04-08: 500 mg via ORAL
  Filled 2011-04-07: qty 1

## 2011-04-07 MED ORDER — METOCLOPRAMIDE HCL 5 MG/ML IJ SOLN
5.0000 mg | Freq: Three times a day (TID) | INTRAMUSCULAR | Status: DC | PRN
  Administered 2011-04-08: 10 mg via INTRAVENOUS

## 2011-04-07 MED ORDER — DIPHENHYDRAMINE HCL 12.5 MG/5ML PO ELIX: 12.5000 mg | ORAL_SOLUTION | ORAL | Status: DC | PRN

## 2011-04-07 MED ORDER — HYDROMORPHONE 0.3 MG/ML IV SOLN
INTRAVENOUS | Status: DC
  Administered 2011-04-07: 0.999 mg via INTRAVENOUS
  Administered 2011-04-07: 0.399 mg via INTRAVENOUS
  Administered 2011-04-07: 09:00:00 via INTRAVENOUS
  Administered 2011-04-08: 0.2 mg via INTRAVENOUS
  Filled 2011-04-07: qty 25

## 2011-04-07 MED ORDER — ONDANSETRON HCL 4 MG PO TABS: 4.0000 mg | ORAL_TABLET | Freq: Four times a day (QID) | ORAL | Status: DC | PRN

## 2011-04-07 MED ORDER — OXYCODONE HCL 5 MG PO TABS
5.0000 mg | ORAL_TABLET | ORAL | Status: DC | PRN
  Administered 2011-04-08 (×2): 10 mg via ORAL
  Filled 2011-04-07 (×2): qty 2

## 2011-04-07 MED ORDER — METOCLOPRAMIDE HCL 10 MG PO TABS: 5.0000 mg | ORAL_TABLET | Freq: Three times a day (TID) | ORAL | Status: DC | PRN

## 2011-04-07 MED ORDER — ONDANSETRON HCL 4 MG/2ML IJ SOLN
INTRAMUSCULAR | Status: DC | PRN
  Administered 2011-04-07: 4 mg via INTRAVENOUS

## 2011-04-07 MED ORDER — CEFAZOLIN SODIUM 1-5 GM-% IV SOLN
1.0000 g | Freq: Once | INTRAVENOUS | Status: AC
  Administered 2011-04-07: 1 g via INTRAVENOUS
  Filled 2011-04-07: qty 50

## 2011-04-07 MED ORDER — SODIUM CHLORIDE 0.9 % IJ SOLN: 9.0000 mL | INTRAMUSCULAR | Status: DC | PRN

## 2011-04-07 MED ORDER — LACTATED RINGERS IV SOLN: INTRAVENOUS | Status: DC

## 2011-04-07 MED ORDER — ONDANSETRON HCL 4 MG/2ML IJ SOLN
4.0000 mg | Freq: Four times a day (QID) | INTRAMUSCULAR | Status: DC | PRN
  Filled 2011-04-07: qty 2

## 2011-04-07 MED ORDER — FENTANYL CITRATE 0.05 MG/ML IJ SOLN
INTRAMUSCULAR | Status: DC | PRN
  Administered 2011-04-07: 100 ug via INTRAVENOUS

## 2011-04-07 MED ORDER — BISACODYL 10 MG RE SUPP: 10.0000 mg | Freq: Every day | RECTAL | Status: DC | PRN

## 2011-04-07 MED ORDER — ZOLPIDEM TARTRATE 5 MG PO TABS: 5.0000 mg | ORAL_TABLET | Freq: Every evening | ORAL | Status: DC | PRN

## 2011-04-07 MED ORDER — PHENOL 1.4 % MT LIQD
1.0000 | OROMUCOSAL | Status: DC | PRN
  Filled 2011-04-07: qty 177

## 2011-04-07 MED ORDER — POLYETHYLENE GLYCOL 3350 17 G PO PACK
17.0000 g | PACK | Freq: Every day | ORAL | Status: DC | PRN
  Filled 2011-04-07: qty 1

## 2011-04-07 MED ORDER — CEFAZOLIN SODIUM 1-5 GM-% IV SOLN
1.0000 g | Freq: Four times a day (QID) | INTRAVENOUS | Status: AC
  Administered 2011-04-07 – 2011-04-08 (×3): 1 g via INTRAVENOUS
  Filled 2011-04-07 (×3): qty 50

## 2011-04-07 MED ORDER — FENTANYL CITRATE 0.05 MG/ML IJ SOLN: 25.0000 ug | INTRAMUSCULAR | Status: DC | PRN

## 2011-04-07 MED ORDER — FLEET ENEMA 7-19 GM/118ML RE ENEM: 1.0000 | ENEMA | Freq: Once | RECTAL | Status: AC | PRN

## 2011-04-07 MED ORDER — ONDANSETRON HCL 4 MG/2ML IJ SOLN
4.0000 mg | Freq: Four times a day (QID) | INTRAMUSCULAR | Status: DC | PRN
  Administered 2011-04-07 – 2011-04-08 (×2): 4 mg via INTRAVENOUS
  Filled 2011-04-07: qty 2

## 2011-04-07 MED ORDER — RIVAROXABAN 10 MG PO TABS
10.0000 mg | ORAL_TABLET | Freq: Every day | ORAL | Status: DC
  Administered 2011-04-08 – 2011-04-09 (×2): 10 mg via ORAL
  Filled 2011-04-07 (×2): qty 1

## 2011-04-07 MED ORDER — TEMAZEPAM 15 MG PO CAPS: 15.0000 mg | ORAL_CAPSULE | Freq: Every evening | ORAL | Status: DC | PRN

## 2011-04-07 MED ORDER — BUPIVACAINE 0.25 % ON-Q PUMP DUAL CATH 300 ML
300.0000 mL | INJECTION | Status: DC
  Filled 2011-04-07: qty 300

## 2011-04-07 MED ORDER — SODIUM CHLORIDE 0.9 % IV SOLN
INTRAVENOUS | Status: DC
  Administered 2011-04-07 – 2011-04-08 (×2): via INTRAVENOUS

## 2011-04-07 MED ORDER — ACETAMINOPHEN 10 MG/ML IV SOLN
INTRAVENOUS | Status: DC | PRN
  Administered 2011-04-07: 1000 mg via INTRAVENOUS

## 2011-04-07 MED ORDER — ACETAMINOPHEN 325 MG PO TABS: 650.0000 mg | ORAL_TABLET | Freq: Four times a day (QID) | ORAL | Status: DC | PRN

## 2011-04-07 MED ORDER — EPHEDRINE SULFATE 50 MG/ML IJ SOLN
INTRAMUSCULAR | Status: DC | PRN
  Administered 2011-04-07 (×3): 5 mg via INTRAVENOUS

## 2011-04-07 MED ORDER — ACETAMINOPHEN 10 MG/ML IV SOLN
1000.0000 mg | Freq: Four times a day (QID) | INTRAVENOUS | Status: AC
  Administered 2011-04-07 (×3): 1000 mg via INTRAVENOUS
  Filled 2011-04-07 (×4): qty 100

## 2011-04-07 MED ORDER — DIPHENHYDRAMINE HCL 50 MG/ML IJ SOLN: 12.5000 mg | Freq: Four times a day (QID) | INTRAMUSCULAR | Status: DC | PRN

## 2011-04-07 MED ORDER — DIPHENHYDRAMINE HCL 12.5 MG/5ML PO ELIX: 12.5000 mg | ORAL_SOLUTION | Freq: Four times a day (QID) | ORAL | Status: DC | PRN

## 2011-04-07 MED ORDER — NALOXONE HCL 0.4 MG/ML IJ SOLN: 0.4000 mg | INTRAMUSCULAR | Status: DC | PRN

## 2011-04-07 MED ORDER — PROMETHAZINE HCL 25 MG/ML IJ SOLN: 6.2500 mg | INTRAMUSCULAR | Status: DC | PRN

## 2011-04-07 MED ORDER — MENTHOL 3 MG MT LOZG
1.0000 | LOZENGE | OROMUCOSAL | Status: DC | PRN
  Filled 2011-04-07: qty 9

## 2011-04-07 MED ORDER — LACTATED RINGERS IV SOLN
INTRAVENOUS | Status: DC | PRN
  Administered 2011-04-07 (×2): via INTRAVENOUS

## 2011-04-07 SURGICAL SUPPLY — 50 items
BAG ZIPLOCK 12X15 (MISCELLANEOUS) ×2 IMPLANT
BANDAGE ELASTIC 6 VELCRO ST LF (GAUZE/BANDAGES/DRESSINGS) ×2 IMPLANT
BANDAGE ESMARK 6X9 LF (GAUZE/BANDAGES/DRESSINGS) ×1 IMPLANT
BLADE SAG 18X100X1.27 (BLADE) ×2 IMPLANT
BLADE SAW SGTL 11.0X1.19X90.0M (BLADE) ×2 IMPLANT
BNDG ESMARK 6X9 LF (GAUZE/BANDAGES/DRESSINGS) ×2
BOWL SMART MIX CTS (DISPOSABLE) ×2 IMPLANT
CATH KIT ON-Q SILVERSOAK 5IN (CATHETERS) ×2 IMPLANT
CEMENT HV SMART SET (Cement) ×2 IMPLANT
CLOTH BEACON ORANGE TIMEOUT ST (SAFETY) ×2 IMPLANT
CUFF TOURN SGL QUICK 34 (TOURNIQUET CUFF) ×1
CUFF TRNQT CYL 34X4X40X1 (TOURNIQUET CUFF) ×1 IMPLANT
DRAPE EXTREMITY T 121X128X90 (DRAPE) ×2 IMPLANT
DRAPE POUCH INSTRU U-SHP 10X18 (DRAPES) ×2 IMPLANT
DRAPE U-SHAPE 47X51 STRL (DRAPES) ×2 IMPLANT
DRSG ADAPTIC 3X8 NADH LF (GAUZE/BANDAGES/DRESSINGS) ×2 IMPLANT
DRSG PAD ABDOMINAL 8X10 ST (GAUZE/BANDAGES/DRESSINGS) ×2 IMPLANT
DURAPREP 26ML APPLICATOR (WOUND CARE) ×2 IMPLANT
ELECT REM PT RETURN 9FT ADLT (ELECTROSURGICAL) ×2
ELECTRODE REM PT RTRN 9FT ADLT (ELECTROSURGICAL) ×1 IMPLANT
EVACUATOR 1/8 PVC DRAIN (DRAIN) ×2 IMPLANT
FACESHIELD LNG OPTICON STERILE (SAFETY) ×10 IMPLANT
GLOVE BIO SURGEON STRL SZ7.5 (GLOVE) ×2 IMPLANT
GLOVE BIO SURGEON STRL SZ8 (GLOVE) ×2 IMPLANT
GLOVE BIOGEL PI IND STRL 8 (GLOVE) ×2 IMPLANT
GLOVE BIOGEL PI INDICATOR 8 (GLOVE) ×2
GOWN STRL NON-REIN LRG LVL3 (GOWN DISPOSABLE) ×2 IMPLANT
GOWN STRL REIN XL XLG (GOWN DISPOSABLE) ×2 IMPLANT
HANDPIECE INTERPULSE COAX TIP (DISPOSABLE) ×1
IMMOBILIZER KNEE 20 (SOFTGOODS) ×2
IMMOBILIZER KNEE 20 THIGH 36 (SOFTGOODS) ×1 IMPLANT
KIT BASIN OR (CUSTOM PROCEDURE TRAY) ×2 IMPLANT
MANIFOLD NEPTUNE II (INSTRUMENTS) ×2 IMPLANT
NS IRRIG 1000ML POUR BTL (IV SOLUTION) ×2 IMPLANT
PACK TOTAL JOINT (CUSTOM PROCEDURE TRAY) ×2 IMPLANT
PAD ABD 7.5X8 STRL (GAUZE/BANDAGES/DRESSINGS) ×2 IMPLANT
PADDING CAST COTTON 6X4 STRL (CAST SUPPLIES) ×6 IMPLANT
POSITIONER SURGICAL ARM (MISCELLANEOUS) ×2 IMPLANT
SET HNDPC FAN SPRY TIP SCT (DISPOSABLE) ×1 IMPLANT
SPONGE GAUZE 4X4 12PLY (GAUZE/BANDAGES/DRESSINGS) ×2 IMPLANT
STRIP CLOSURE SKIN 1/2X4 (GAUZE/BANDAGES/DRESSINGS) ×4 IMPLANT
SUCTION FRAZIER 12FR DISP (SUCTIONS) ×2 IMPLANT
SUT MNCRL AB 4-0 PS2 18 (SUTURE) ×2 IMPLANT
SUT PDS AB 1 CT1 27 (SUTURE) ×6 IMPLANT
SUT VIC AB 2-0 CT1 27 (SUTURE) ×3
SUT VIC AB 2-0 CT1 TAPERPNT 27 (SUTURE) ×3 IMPLANT
TOWEL OR 17X26 10 PK STRL BLUE (TOWEL DISPOSABLE) ×4 IMPLANT
TRAY FOLEY CATH 14FRSI W/METER (CATHETERS) ×2 IMPLANT
WATER STERILE IRR 1500ML POUR (IV SOLUTION) ×2 IMPLANT
WRAP KNEE MAXI GEL POST OP (GAUZE/BANDAGES/DRESSINGS) ×2 IMPLANT

## 2011-04-07 NOTE — Transfer of Care (Signed)
Immediate Anesthesia Transfer of Care Note  Patient: Marilyn Crosby  Procedure(s) Performed:  TOTAL KNEE ARTHROPLASTY  Patient Location: PACU  Anesthesia Type: MAC combined with regional for post-op pain  Level of Consciousness: awake, alert  and oriented  Airway & Oxygen Therapy: Patient Spontanous Breathing and Patient connected to face mask oxygen  Post-op Assessment: Report given to PACU RN and Post -op Vital signs reviewed and stable  Post vital signs: Reviewed and stable  Complications: No apparent anesthesia complications

## 2011-04-07 NOTE — H&P (View-Only) (Signed)
Marilyn Crosby  DOB: 11/02/1940 Married /  Race: White / Female  Date Of Admission:  04/07/2011  Chief Complaint:  Right Knee Pain  History of Present Illness The patient is a 70 year old female who comes in today for a preoperative History and Physical. The patient is scheduled for a right total knee arthroplasty to be performed by Dr. Frank V. Aluisio, MD at [facility] on 04/07/2011. They have been treated conservatively in the past for the above stated problem and despite conservative measures, they continue to have progressive pain and severe functional limitations and dysfunction. They have failed non-operative management including home exercise, medications, and injections. It is felt that they would benefit from undergoing total joint replacement. Risks and benefits of the procedure have been discussed with the patient and they elect to proceed with surgery. There are no active contraindications to surgery such as ongoing infection or rapidly progressive neurological disease.  Allergies Morphine Derivatives - Itching.  Medication List Synthroid ( Oral) Specific dose unknown - Active. Aspirin Childrens (81MG Tablet Chewable, Oral daily) Active.  Problem List/Past Medical Osteoarthritis Breast Cancer Hypothyroidism  Past Surgical History Breast Biopsy. Date: 2011. left Breast Mass; Local Excision. Date: 2011. Left Lumpectomy Cataract Surgery. bilateral in 1995, and right again in 2005 Colon Polyp Removal - Colonoscopy. Date: 09/2010. Hysterectomy. complete (non-cancerous) Straighten Nasal Septum. Date: 1975. Total Knee Replacement. Date: 2009. left Tonsillectomy. Date: 1947. Bunionectomy. 1995 and 1996 Left Eye Laser Surgery for Macular Hemorrhage. Date: 1983.  Family History Cancer. father Father. Deceased. age 82, septic shock following a pneumonia Mother. Deceased. age 96, macular degeneration, dementia Brother 1. Prostate Cancer.  Social  History Tobacco use. never smoker Alcohol use. never consumed alcohol Marital status. married Living situation. live with spouse Current work status. retired Children. 2  Review of Systems General:Not Present- Chills, Fever, Night Sweats, Fatigue, Weight Gain, Weight Loss and Memory Loss. Skin:Not Present- Hives, Itching, Rash, Eczema and Lesions. HEENT:Not Present- Tinnitus, Headache, Double Vision, Visual Loss, Hearing Loss and Dentures. Respiratory:Present- Cough (dry, nonproductive). Not Present- Shortness of breath with exertion, Shortness of breath at rest, Allergies and Coughing up blood. Cardiovascular:Not Present- Chest Pain, Racing/skipping heartbeats, Difficulty Breathing Lying Down, Murmur, Swelling and Palpitations. Gastrointestinal:Not Present- Bloody Stool, Heartburn, Abdominal Pain, Vomiting, Nausea, Constipation, Diarrhea, Difficulty Swallowing, Jaundice and Loss of appetitie. Female Genitourinary:Not Present- Blood in Urine, Urinary frequency, Weak urinary stream, Discharge, Flank Pain, Incontinence, Painful Urination, Urgency, Urinary Retention and Urinating at Night. Musculoskeletal:Present- Joint Pain. Not Present- Muscle Weakness, Muscle Pain, Joint Swelling, Back Pain, Morning Stiffness and Spasms. Neurological:Not Present- Tremor, Dizziness, Blackout spells, Paralysis, Difficulty with balance and Weakness. Psychiatric:Not Present- Insomnia.  Vitals Weight: 145 lb Height: 65 in Body Surface Area: 1.74 m Body Mass Index: 24.13 kg/m Pulse: 76 (Regular) Resp.: 14 (Unlabored) BP: 152/80 (Sitting, Right Arm, Standard)  Physical Exam The physical exam findings are as follows: Patient is a 70 year old female with continued right knee pain.  General Mental Status - Alert, cooperative and good historian. General Appearance- pleasant. Not in acute distress. Orientation- Oriented X3. Build & Nutrition- Well nourished and Well  developed.  Head and Neck Head- normocephalic, atraumatic . Neck Global Assessment- supple. no bruit auscultated on the right and no bruit auscultated on the left.  Eye Pupil- Bilateral- Regular and Round. Motion- Bilateral- EOMI. wears glasses  Chest and Lung Exam Auscultation: Breath sounds:- clear at anterior chest wall and - clear at posterior chest wall. Adventitious sounds:- No Adventitious sounds.    Cardiovascular Auscultation:Rhythm- Regular rate and rhythm. Heart Sounds- S1 WNL and S2 WNL. Murmurs & Other Heart Sounds:Auscultation of the heart reveals - No Murmurs.  Abdomen Palpation/Percussion:Tenderness- Abdomen is non-tender to palpation. Rigidity (guarding)- Abdomen is soft. Auscultation:Auscultation of the abdomen reveals - Bowel sounds normal.  Female Genitourinary Not done, not pertinent to present illness  Musculoskeletal Left knee looks great. There is no swelling. Range is 0 to 125. No tenderness or instability. Right knee shows no effusion. There is marked crepitus on range of motion. Range is about 10 to 120. She is tender medial greater than lateral. There is no instability noted about the right knee. Gait pattern is antalgic.  RADIOGRAPHS: Reviewed of AP of both knees and lateral. Her prosthesis on the left is in excellent position with no periprosthetic abnormalities. Right knee shows bone on bone arthritis in the medial and patellofemoral compartments. She has varus deformity of about 10 degrees. She has some subluxation of the tibia and the femur. She has significant periarticular osteophytes.  Assessment & Plan Osteoarthritis Right Knee  Patient is for a Right Total Knee Replacement by Dr. Frank Aluisio.  Plan is to go home following the procedure.  Drew Perkins, PA-C   

## 2011-04-07 NOTE — Anesthesia Preprocedure Evaluation (Signed)
Anesthesia Evaluation  Patient identified by MRN, date of birth, ID band Patient awake    Reviewed: Allergy & Precautions, H&P , NPO status , Patient's Chart, lab work & pertinent test results, reviewed documented beta blocker date and time   History of Anesthesia Complications (+) PONV  Airway Mallampati: II TM Distance: >3 FB Neck ROM: Full    Dental  (+) Teeth Intact   Pulmonary neg pulmonary ROS,  clear to auscultation        Cardiovascular neg cardio ROS Regular Normal Denies cardiac symptoms   Neuro/Psych Negative Neurological ROS  Negative Psych ROS   GI/Hepatic negative GI ROS, Neg liver ROS,   Endo/Other  Hypothyroidism Thyroid replacement  Renal/GU negative Renal ROS  Genitourinary negative   Musculoskeletal negative musculoskeletal ROS (+)   Abdominal   Peds negative pediatric ROS (+)  Hematology negative hematology ROS (+)   Anesthesia Other Findings Front caps  Reproductive/Obstetrics negative OB ROS                           Anesthesia Physical Anesthesia Plan  ASA: II  Anesthesia Plan: Spinal   Post-op Pain Management:    Induction: Intravenous  Airway Management Planned: Mask  Additional Equipment:   Intra-op Plan:   Post-operative Plan:   Informed Consent: I have reviewed the patients History and Physical, chart, labs and discussed the procedure including the risks, benefits and alternatives for the proposed anesthesia with the patient or authorized representative who has indicated his/her understanding and acceptance.     Plan Discussed with: CRNA and Surgeon  Anesthesia Plan Comments:         Anesthesia Quick Evaluation

## 2011-04-07 NOTE — Preoperative (Signed)
Beta Blockers   Reason not to administer Beta Blockers:Not Applicable 

## 2011-04-07 NOTE — Progress Notes (Signed)
Utilization review completed.  

## 2011-04-07 NOTE — Op Note (Signed)
Pre-operative diagnosis- Osteoarthritis  Right knee(s)  Post-operative diagnosis- Osteoarthritis Right knee(s)  Procedure-  Right  Total Knee Arthroplasty  Surgeon- Marilyn Rankin. Mareena Cavan, MD  Assistant- Avel Peace, PA-C   Anesthesia-  Spinal EBL-* No blood loss amount entered *  Drains Hemovac  Tourniquet time-  Total Tourniquet Time Documented: Thigh (Right) - 29 minutes   Complications- None  Condition-PACU - hemodynamically stable.   Brief Clinical Note   Marilyn Crosby is a 71 y.o. year old female with end stage OA of her right knee with progressively worsening pain and dysfunction. She has constant pain, with activity and at rest and significant functional deficits with difficulties even with ADLs. She has had extensive non-op management including analgesics, injections of cortisone and viscosupplements, and home exercise program, but remains in significant pain with significant dysfunction.Radiographs show bone on bone arthritis medial and patellofemoral with varus deformity and tibial subluxation. She presents now for left Total Knee Arthroplasty.   Procedure in detail---   The patient is brought into the operating room and positioned supine on the operating table. After successful administration of  Spinal,   a tourniquet is placed high on the  Right thigh(s) and the lower extremity is prepped and draped in the usual sterile fashion. Time out is performed by the operating team and then the  Right lower extremity is wrapped in Esmarch, knee flexed and the tourniquet inflated to 300 mmHg.       A midline incision is made with a ten blade through the subcutaneous tissue to the level of the extensor mechanism. A fresh blade is used to make a medial parapatellar arthrotomy. Soft tissue over the proximal medial tibia is subperiosteally elevated to the joint line with a knife and into the semimembranosus bursa with a Cobb elevator. Soft tissue over the proximal lateral tibia is elevated  with attention being paid to avoiding the patellar tendon on the tibial tubercle. The patella is everted, knee flexed 90 degrees and the ACL and PCL are removed. Findings are bone on bone medial and patellofemoral with large medial osteophytes.        The drill is used to create a starting hole in the distal femur and the canal is thoroughly irrigated with sterile saline to remove the fatty contents. The 5 degree Right  valgus alignment guide is placed into the femoral canal and the distal femoral cutting block is pinned to remove 11 mm off the distal femur. Resection is made with an oscillating saw.      The tibia is subluxed forward and the menisci are removed. The extramedullary alignment guide is placed referencing proximally at the medial aspect of the tibial tubercle and distally along the second metatarsal axis and tibial crest. The block is pinned to remove 2mm off the more deficient medial  side. Resection is made with an oscillating saw. Size 3is the most appropriate size for the tibia and the proximal tibia is prepared with the modular drill and keel punch for that size.      The femoral sizing guide is placed and size 3 is most appropriate. Rotation is marked off the epicondylar axis and confirmed by creating a rectangular flexion gap at 90 degrees. The size 3 cutting block is pinned in this rotation and the anterior, posterior and chamfer cuts are made with the oscillating saw. The intercondylar block is then placed and that cut is made.      Trial size 3 tibial component, trial size 3 posterior stabilized  femur and a 12.5  mm posterior stabilized rotating platform insert trial is placed. Full extension is achieved with excellent varus/valgus and anterior/posterior balance throughout full range of motion. The patella is everted and thickness measured to be 21  mm. Free hand resection is taken to 12 mm, a 35 template is placed, lug holes are drilled, trial patella is placed, and it tracks normally.  Osteophytes are removed off the posterior femur with the trial in place. All trials are removed and the cut bone surfaces prepared with pulsatile lavage. Cement is mixed and once ready for implantation, the size 3 tibial implant, size  3 posterior stabilized femoral component, and the size 35 patella are cemented in place and the patella is held with the clamp. The trial insert is placed and the knee held in full extension. All extruded cement is removed and once the cement is hard the permanent 12.5 mm posterior stabilized rotating platform insert is placed into the tibial tray.      The wound is copiously irrigated with saline solution and the extensor mechanism closed over a hemovac drain with #1 PDS suture. The tourniquet is released for a total tourniquet time of 29  minutes. Flexion against gravity is 140 degrees and the patella tracks normally. Subcutaneous tissue is closed with 2.0 vicryl and subcuticular with running 4.0 Monocryl. The catheter for the Marcaine pain pump is placed and the pump is initiated. The incision is cleaned and dried and steri-strips and a bulky sterile dressing are applied. The limb is placed into a knee immobilizer and the patient is awakened and transported to recovery in stable condition.      Please note that a surgical assistant was a medical necessity for this procedure in order to perform it in a safe and expeditious manner. Surgical assistant was necessary to retract the ligaments and vital neurovascular structures to prevent injury to them and also necessary for proper positioning of the limb to allow for anatomic placement of the prosthesis.   Marilyn Rankin Kaniesha Barile, MD    04/07/2011, 8:20 AM

## 2011-04-07 NOTE — Anesthesia Procedure Notes (Signed)
Spinal  Patient location during procedure: OR Start time: 04/07/2011 7:19 AM End time: 04/07/2011 7:25 AM Staffing Anesthesiologist: Lestine Box B Preanesthetic Checklist Completed: patient identified, site marked, surgical consent, pre-op evaluation, timeout performed, IV checked, risks and benefits discussed and monitors and equipment checked Spinal Block Patient position: sitting Prep: Betadine and site prepped and draped Patient monitoring: heart rate, cardiac monitor, continuous pulse ox and blood pressure Approach: right paramedian Location: L3-4 Injection technique: single-shot Needle Needle type: Quincke  Needle gauge: 25 G Needle length: 10 cm Needle insertion depth: 3 cm Assessment Sensory level: T6 Additional Notes [id number], 2013-11

## 2011-04-07 NOTE — Interval H&P Note (Signed)
History and Physical Interval Note:  04/07/2011 7:15 AM  Marilyn Crosby  has presented today for surgery, with the diagnosis of Osteoarthritis of the Right Knee  The various methods of treatment have been discussed with the patient and family. After consideration of risks, benefits and other options for treatment, the patient has consented to  Procedure(s): TOTAL KNEE ARTHROPLASTY as a surgical intervention .  The patients' history has been reviewed, patient examined, no change in status, stable for surgery.  I have reviewed the patients' chart and labs.  Questions were answered to the patient's satisfaction.     Loanne Drilling

## 2011-04-07 NOTE — Plan of Care (Signed)
Problem: Consults Goal: Diagnosis- Total Joint Replacement Primary Total Knee     

## 2011-04-07 NOTE — Anesthesia Postprocedure Evaluation (Signed)
  Anesthesia Post-op Note  Patient: Marilyn Crosby  Procedure(s) Performed:  TOTAL KNEE ARTHROPLASTY  Patient Location: PACU  Anesthesia Type: Spinal  Level of Consciousness: oriented and sedated  Airway and Oxygen Therapy: Patient Spontanous Breathing and Patient connected to nasal cannula oxygen  Post-op Pain: none  Post-op Assessment: Post-op Vital signs reviewed, Patient's Cardiovascular Status Stable, Respiratory Function Stable and Patent Airway  Post-op Vital Signs: stable  Complications: No apparent anesthesia complications

## 2011-04-08 LAB — CBC
HCT: 28.7 % — ABNORMAL LOW (ref 36.0–46.0)
Hemoglobin: 9.4 g/dL — ABNORMAL LOW (ref 12.0–15.0)
MCV: 98.3 fL (ref 78.0–100.0)
RDW: 12.4 % (ref 11.5–15.5)
WBC: 5.3 10*3/uL (ref 4.0–10.5)

## 2011-04-08 LAB — BASIC METABOLIC PANEL
BUN: 7 mg/dL (ref 6–23)
CO2: 27 mEq/L (ref 19–32)
Chloride: 104 mEq/L (ref 96–112)
Creatinine, Ser: 0.58 mg/dL (ref 0.50–1.10)
Glucose, Bld: 116 mg/dL — ABNORMAL HIGH (ref 70–99)
Potassium: 3.6 mEq/L (ref 3.5–5.1)

## 2011-04-08 MED ORDER — HYDROMORPHONE HCL PF 1 MG/ML IJ SOLN: 0.5000 mg | INTRAMUSCULAR | Status: DC | PRN

## 2011-04-08 MED ORDER — POLYSACCHARIDE IRON 150 MG PO CAPS
150.0000 mg | ORAL_CAPSULE | Freq: Every day | ORAL | Status: DC
  Filled 2011-04-08 (×2): qty 1

## 2011-04-08 NOTE — Progress Notes (Signed)
Subjective: 1 Day Post-Op Procedure(s) (LRB): TOTAL KNEE ARTHROPLASTY (Right) Patient reports pain as mild.   Patient seen in rounds with Dr. Lequita Halt. Patient with soreness, not much sleep. We will start therapy today. Plan is to go home after hospital stay.  Objective: Vital signs in last 24 hours: Temp:  [97.1 F (36.2 C)-98.4 F (36.9 C)] 98.4 F (36.9 C) (01/22 0515) Pulse Rate:  [65-82] 82  (01/22 0515) Resp:  [12-20] 18  (01/22 0801) BP: (125-156)/(56-86) 126/76 mmHg (01/22 0515) SpO2:  [98 %-100 %] 100 % (01/22 0801) FiO2 (%):  [98 %] 98 % (01/22 0318) Weight:  [65.318 kg (144 lb)] 65.318 kg (144 lb) (01/21 1516)  Intake/Output from previous day:  Intake/Output Summary (Last 24 hours) at 04/08/11 0848 Last data filed at 04/08/11 9147  Gross per 24 hour  Intake   1930 ml  Output   2420 ml  Net   -490 ml    Intake/Output this shift: UOP 800  Labs:  [facility] 04/08/11 0340  HGB 9.4*    Basename 04/08/11 0340  WBC 5.3  RBC 2.92*  HCT 28.7*  PLT 160    Basename 04/08/11 0340  NA 137  K 3.6  CL 104  CO2 27  BUN 7  CREATININE 0.58  GLUCOSE 116*  CALCIUM 8.2*   No results found for this basename: LABPT:2,INR:2 in the last 72 hours  Exam - Neurovascular intact Sensation intact distally Dressing - clean, dry Motor function intact - moving foot and toes well on exam.  Hemovac pulled without difficulty.  Past Medical History  Diagnosis Date  . Cancer 03-31-11    left breast 2011-radiation only  . Arthritis 03-31-11    osteoarthritis-knees and hands  . Ankle fracture, left 03-31-11    '09-soft cast only  . Hx of colonic polyps   . Cystocele     HX OF  . Rectocele   . Pneumonia     Assessment/Plan: 1 Day Post-Op Procedure(s) (LRB): TOTAL KNEE ARTHROPLASTY (Right) Principal Problem:  *OA (osteoarthritis) of knee   Advance diet Up with therapy Discharge home with home health when meet goals.  DVT Prophylaxis - Xarelto   Protocol Weight-Bearing as tolerated to right leg Keep foley until tomorrow. No vaccines. D/C PCA Dilaudid, Change to IV push D/C O2 and Pulse OX and try on Room [address]  Patrica Duel 04/08/2011, 8:48 AM

## 2011-04-08 NOTE — Progress Notes (Signed)
Physical Therapy Treatment Patient Details Name: Marilyn Crosby MRN: [id number] DOB: Jan 31, 1941 Today's Date: 04/08/2011  [id number] TE  PT Assessment/Plan  PT - Assessment/Plan Comments on Treatment Session: Pt refused ambulation due to nausea.  RN aware and pt given medicine for nausea.   PT Plan: Discharge plan remains appropriate PT Frequency: 7X/week Follow Up Recommendations: Home health PT Equipment Recommended: None recommended by PT PT Goals  Acute Rehab PT Goals PT Goal Formulation: With patient Time For Goal Achievement: 4 days Pt will Perform Home Exercise Program: with supervision, verbal cues required/provided PT Goal: Perform Home Exercise Program - Progress: Progressing toward goal  PT Treatment Precautions/Restrictions  Precautions Precautions: Knee Required Braces or Orthoses: Yes Knee Immobilizer: Discontinue once straight leg raise with < 10 degree lag Restrictions Weight Bearing Restrictions: Yes RLE Weight Bearing: Weight bearing as tolerated Mobility (including Balance) Bed Mobility Bed Mobility: No    Exercise  Total Joint Exercises Ankle Circles/Pumps: AROM;20 reps;Both;Supine Quad Sets: AROM;Right;10 reps;Supine Short Arc Quad: AAROM;Right;10 reps;Supine Heel Slides: AAROM;Right;10 reps;Supine Hip ABduction/ADduction: AAROM;Right;10 reps;Supine Straight Leg Raises: AROM;Right;10 reps;Supine End of Session PT - End of Session Activity Tolerance: Other (comment) (Pt limited by nausea) Patient left: in bed;with call bell in reach;with family/visitor present Nurse Communication: Other (comment) (RN aware of nausea) General Behavior During Session: [facility] for tasks performed Cognition: [facility] for tasks performed  Page, Meribeth Mattes 04/08/2011, 3:44 PM

## 2011-04-08 NOTE — Evaluation (Signed)
Physical Therapy Evaluation Patient Details Name: Marilyn Crosby MRN: [id number] DOB: 1940-04-01 Today's Date: 04/08/2011  [phone] EVII  Problem List:  Patient Active Problem List  Diagnoses  . OA (osteoarthritis) of knee    Past Medical History:  Past Medical History  Diagnosis Date  . Cancer 03-31-11    left breast 2011-radiation only  . Arthritis 03-31-11    osteoarthritis-knees and hands  . Ankle fracture, left 03-31-11    '09-soft cast only  . Hx of colonic polyps   . Cystocele     HX OF  . Rectocele   . Pneumonia    Past Surgical History:  Past Surgical History  Procedure Date  . Abdominal hysterectomy 03-31-11    2010-Prolapse uterus  . Breast surgery 03-31-11    Lumpectomy-dx. 2011 left breast cancer-radiation  . Joint replacement 03-31-11    LTKA('09), now rt. planned  . Bunionectomy 03-31-11    bil.'96/'99  . Hammer toe surgery 03-31-11    bil.   . Cataract extraction w/ intraocular lens implant 03-31-11    bil.'95 and rt.eye again '05  . Nasal septum surgery 03-31-11    deviated septum '75  . Ganglion cyst excision 03-31-11    left wrist  . Tonsillectomy 1947    child  . Colonoscopy w/ polypectomy 09/21/2000    PT Assessment/Plan/Recommendation PT Assessment Clinical Impression Statement: Pt presents s/p R TKA POD 1 with decreased strength and mobility.  Pt tolerated ambulation well with slight increase in pain upon returning to chair.  Pts O2 sats 99% on room air, stated pt could stay off O2.  Pt would benefit from skilled PT in acute venue to address deficits.  PT recommends HHPT for follow up therapy at D/C .   PT Recommendation/Assessment: Patient will need skilled PT in the acute care venue PT Problem List: Decreased strength;Decreased range of motion;Decreased activity tolerance;Decreased mobility;Decreased knowledge of use of DME;Pain Barriers to Discharge: None PT Therapy Diagnosis : Difficulty walking;Abnormality of gait;Acute pain;Generalized  weakness PT Plan PT Frequency: 7X/week PT Treatment/Interventions: DME instruction;Gait training;Stair training;Functional mobility training;Therapeutic activities;Therapeutic exercise;Patient/family education PT Recommendation Follow Up Recommendations: Home health PT Equipment Recommended: None recommended by PT PT Goals  Acute Rehab PT Goals PT Goal Formulation: With patient Time For Goal Achievement: 4 days Pt will go Supine/Side to Sit: with supervision PT Goal: Supine/Side to Sit - Progress: Goal set today Pt will go Sit to Supine/Side: with supervision PT Goal: Sit to Supine/Side - Progress: Goal set today Pt will go Sit to Stand: with supervision PT Goal: Sit to Stand - Progress: Goal set today Pt will go Stand to Sit: with supervision PT Goal: Stand to Sit - Progress: Goal set today Pt will Ambulate: 51 - 150 feet;with supervision;with least restrictive assistive device PT Goal: Ambulate - Progress: Goal set today Pt will Go Up / Down Stairs: 3-5 stairs;with min assist;with least restrictive assistive device PT Goal: Up/Down Stairs - Progress: Goal set today Pt will Perform Home Exercise Program: with supervision, verbal cues required/provided PT Goal: Perform Home Exercise Program - Progress: Goal set today  PT Evaluation Precautions/Restrictions  Precautions Precautions: Knee Required Braces or Orthoses: Yes Knee Immobilizer: Discontinue once straight leg raise with < 10 degree lag Restrictions Weight Bearing Restrictions: Yes RLE Weight Bearing: Weight bearing as tolerated Prior Functioning  Home Living Lives With: Spouse Receives Help From: Family Type of Home: House Home Layout: One level Home Access: Stairs to enter Entrance Stairs-Rails: None Entrance Stairs-Number of Steps: 3  Home Adaptive Equipment: Walker - rolling;Bedside commode/3-in-1 Prior Function Level of Independence: Independent with basic ADLs;Independent with gait;Independent with  transfers Driving: Yes Vocation: Retired Producer, television/film/video: Awake/alert Overall Cognitive Status: Appears within functional limits for tasks assessed Orientation Level: Oriented X4 Sensation/Coordination Sensation Light Touch: Appears Intact Coordination Gross Motor Movements are Fluid and Coordinated: Yes Extremity Assessment RLE Assessment RLE Assessment: Exceptions to [facility] RLE Strength RLE Overall Strength Comments: Ankle motions WFL, pt unable to perform SLR.  LLE Assessment LLE Assessment: Within Functional Limits Mobility (including Balance) Bed Mobility Bed Mobility: Yes Supine to Sit: 4: Min assist Supine to Sit Details (indicate cue type and reason): Requires assist with RLE, cues for UE placement to assist trunk.  Transfers Transfers: Yes Sit to Stand: 4: Min assist;3: Mod assist;With upper extremity assist;From bed Sit to Stand Details (indicate cue type and reason): Requires cues for hand placement and RLE management.   Stand to Sit: 4: Min assist;With upper extremity assist;With armrests;To chair/3-in-1 Stand to Sit Details: Requires cues for hand placement and assist with RLE management.  Ambulation/Gait Ambulation/Gait: Yes Ambulation/Gait Assistance: 4: Min assist Ambulation/Gait Assistance Details (indicate cue type and reason): Requires cues for sequencing/technique with RW Ambulation Distance (Feet): 30 Feet Assistive device: Rolling walker Gait Pattern: Step-to pattern Gait velocity: decreased    Exercise    End of Session PT - End of Session Equipment Utilized During Treatment: Gait belt;Right knee immobilizer Activity Tolerance: Patient tolerated treatment well Patient left: in chair;with call bell in reach Nurse Communication: Mobility status for transfers;Mobility status for ambulation  Lessie Dings 04/08/2011, 11:07 AM

## 2011-04-09 ENCOUNTER — Encounter (HOSPITAL_COMMUNITY): Payer: Self-pay | Admitting: Orthopedic Surgery

## 2011-04-09 LAB — BASIC METABOLIC PANEL
BUN: 7 mg/dL (ref 6–23)
Calcium: 8.1 mg/dL — ABNORMAL LOW (ref 8.4–10.5)
Creatinine, Ser: 0.6 mg/dL (ref 0.50–1.10)
GFR calc Af Amer: >90 mL/min (ref >90)
GFR calc non Af Amer: >90 mL/min (ref >90)
Glucose, Bld: 113 mg/dL — ABNORMAL HIGH (ref 70–99)
Potassium: 3.4 mEq/L — ABNORMAL LOW (ref 3.5–5.1)

## 2011-04-09 LAB — CBC
HCT: 25.9 % — ABNORMAL LOW (ref 36.0–46.0)
Hemoglobin: 8.7 g/dL — ABNORMAL LOW (ref 12.0–15.0)
MCH: 32.7 pg (ref 26.0–34.0)
MCHC: 33.6 g/dL (ref 30.0–36.0)
MCV: 97.4 fL (ref 78.0–100.0)
RDW: 12.4 % (ref 11.5–15.5)

## 2011-04-09 MED ORDER — OXYCODONE HCL 5 MG PO TABS: 5.0000 mg | ORAL_TABLET | ORAL | Status: AC | PRN

## 2011-04-09 MED ORDER — METHOCARBAMOL 500 MG PO TABS: 500.0000 mg | ORAL_TABLET | Freq: Four times a day (QID) | ORAL | Status: AC | PRN

## 2011-04-09 MED ORDER — RIVAROXABAN 10 MG PO TABS: 10.0000 mg | ORAL_TABLET | Freq: Every day | ORAL | Status: DC

## 2011-04-09 NOTE — Progress Notes (Signed)
Physical Therapy Treatment Patient Details Name: SHONTA BOURQUE MRN: [id number] DOB: June 19, 1940 Today's Date: 04/09/2011  [phone] 2G  PT Assessment/Plan  PT - Assessment/Plan Comments on Treatment Session: Pt tolerated increased ambulation during am session.  Pt w/o c/o nausea today.  Plans for D/C this pm or tomorrow am.  Continue to recommend HHPT for follow up thearpy.  PT Plan: Discharge plan remains appropriate PT Frequency: 7X/week Follow Up Recommendations: Home health PT Equipment Recommended: None recommended by PT PT Goals  Acute Rehab PT Goals PT Goal Formulation: With patient Time For Goal Achievement: 4 days Pt will go Supine/Side to Sit: with supervision PT Goal: Supine/Side to Sit - Progress: Progressing toward goal Pt will go Sit to Stand: with supervision PT Goal: Sit to Stand - Progress: Progressing toward goal Pt will go Stand to Sit: with supervision PT Goal: Stand to Sit - Progress: Progressing toward goal Pt will Ambulate: 51 - 150 feet;with supervision;with least restrictive assistive device PT Goal: Ambulate - Progress: Progressing toward goal  PT Treatment Precautions/Restrictions  Precautions Precautions: Knee Required Braces or Orthoses: Yes Knee Immobilizer: Discontinue once straight leg raise with < 10 degree lag Restrictions Weight Bearing Restrictions: No RLE Weight Bearing: Weight bearing as tolerated Mobility (including Balance) Bed Mobility Bed Mobility: Yes Supine to Sit: 4: Min assist Supine to Sit Details (indicate cue type and reason): Requires assist with RLE and cues for hand placement to assist trunk into sitting position.  Transfers Transfers: Yes Sit to Stand: 4: Min assist;With upper extremity assist;From bed Sit to Stand Details (indicate cue type and reason): Min/guard for safety with cues for hand placement.  Stand to Sit: 4: Min assist;With upper extremity assist;To chair/3-in-1;With armrests Stand to Sit Details:  Min/guard for safety with cues for RLE management Ambulation/Gait Ambulation/Gait: Yes Ambulation/Gait Assistance: 4: Min assist Ambulation/Gait Assistance Details (indicate cue type and reason): Min/guard for safety, cues for upright posture Ambulation Distance (Feet): 150 Feet Assistive device: Rolling walker Gait Pattern: Step-to pattern Gait velocity: improving    Exercise    End of Session PT - End of Session Equipment Utilized During Treatment: Gait belt;Right knee immobilizer Activity Tolerance: Patient tolerated treatment well Patient left: in chair;with call bell in reach;with family/visitor present General Behavior During Session: [facility] for tasks performed Cognition: [facility] for tasks performed  Page, Meribeth Mattes 04/09/2011, 10:33 AM

## 2011-04-09 NOTE — Progress Notes (Signed)
Physical Therapy Treatment Patient Details Name: Marilyn Crosby MRN: [id number] DOB: [birth year] Today's Date: 04/09/2011  [id number] G  PT Assessment/Plan  PT - Assessment/Plan Comments on Treatment Session: Pt tolerated ambulation and stair training well during pm session.  Pt did not wish to perform exercises.  PT issued handout with pt verbalizing understanding.  Pt to D/C today with HHPT for follow up therapy.  PT Plan: Discharge plan remains appropriate PT Frequency: 7X/week Follow Up Recommendations: Home health PT Equipment Recommended: Rolling walker with 5" wheels PT Goals  Acute Rehab PT Goals PT Goal Formulation: With patient Time For Goal Achievement: 4 days Pt will go Sit to Stand: with supervision PT Goal: Sit to Stand - Progress: Met Pt will go Stand to Sit: with supervision PT Goal: Stand to Sit - Progress: Met Pt will Ambulate: 51 - 150 feet;with supervision;with least restrictive assistive device PT Goal: Ambulate - Progress: Progressing toward goal Pt will Go Up / Down Stairs: 3-5 stairs;with min assist;with least restrictive assistive device PT Goal: Up/Down Stairs - Progress: Progressing toward goal Pt will Perform Home Exercise Program: with supervision, verbal cues required/provided PT Goal: Perform Home Exercise Program - Progress: Progressing toward goal  PT Treatment Precautions/Restrictions  Precautions Precautions: Knee Required Braces or Orthoses: Yes Knee Immobilizer: Discontinue once straight leg raise with < 10 degree lag Restrictions Weight Bearing Restrictions: No RLE Weight Bearing: Weight bearing as tolerated Mobility (including Balance) Bed Mobility Bed Mobility: No Transfers Transfers: Yes Sit to Stand: 5: Supervision;With upper extremity assist;From chair/3-in-1 Sit to Stand Details (indicate cue type and reason): Supervision for safety, cues for hand placement Stand to Sit: 5: Supervision;With upper extremity assist;With  armrests;To chair/3-in-1 Stand to Sit Details: Supervision for safety with cues for hand placement and RLE management.  Ambulation/Gait Ambulation/Gait: Yes Ambulation/Gait Assistance: 4: Min assist Ambulation/Gait Assistance Details (indicate cue type and reason): Min/guard for safety, with cues for RW placement due to pt tendency to let RW get too far ahead, and upright posture.   Ambulation Distance (Feet): 150 Feet Assistive device: Rolling walker Gait Pattern: Step-to pattern Gait velocity: improving Stairs: Yes Stairs Assistance: 4: Min assist Stairs Assistance Details (indicate cue type and reason): Requires cues for sequencing/technique.  Educated sister on Dentist.  Sister verbalized/demonstrated understanding by assisting pt.   Stair Management Technique: No rails;Backwards;Step to pattern;With walker Number of Stairs: 2  Height of Stairs: 6     Exercise    End of Session PT - End of Session Equipment Utilized During Treatment: Gait belt;Right knee immobilizer Activity Tolerance: Patient tolerated treatment well Patient left: in chair;with call bell in reach;with family/visitor present Nurse Communication: Other (comment) (RN notified of pt ready for D/C) General Behavior During Session: [facility] for tasks performed Cognition: [facility] for tasks performed  Page, Meribeth Mattes 04/09/2011, 3:21 PM

## 2011-04-09 NOTE — Progress Notes (Signed)
  CARE MANAGEMENT NOTE 04/09/2011  Patient:  Marilyn Crosby, Marilyn Crosby   Account Number:  [id number]  Date Initiated:  04/09/2011  Documentation initiated by:  Colleen Can  Subjective/Objective Assessment:   dx total rt knee replacemnt     Action/Plan:   Pt plans home with spouse who will be caregiver   Anticipated DC Date:  04/09/2011   Anticipated DC Plan:  HOME W HOME HEALTH SERVICES  In-house referral  NA      DC Planning Services  CM consult      [facility] Choice  HOME HEALTH  DURABLE MEDICAL EQUIPMENT   Choice offered to / List presented to:  C-1 Patient   DME arranged  Levan Hurst      DME agency  [facility]     HH arranged  HH-2 PT      [facility] agency  [facility]   Status of service:  Completed, signed off Medicare Important Message given?  NA - LOS <3 / Initial given by admissions (If response is "NO", the following Medicare IM given date fields will be blank) Date Medicare IM given:   Date Additional Medicare IM given:    Discharge Disposition:  HOME W HOME HEALTH SERVICES  Comments:  04/09/2011 Raynelle Bring BSN CCM [phone] Pt is requesting [facility] services. She used them in the past. Will need rolling walker but has other equipment at thome. Genevieve Norlander can provide services with start date of tomorow 04/10/11.

## 2011-04-09 NOTE — Progress Notes (Signed)
Subjective: 2 Days Post-Op Procedure(s) (LRB): TOTAL KNEE ARTHROPLASTY (Right) Patient reports pain as mild but doing better. Patient seen in rounds with Dr. Lequita Halt. Patient feeling better and wants to go home.  Objective: Vital signs in last 24 hours: Temp:  [97.5 F (36.4 C)-100 F (37.8 C)] 98.8 F (37.1 C) (01/23 0540) Pulse Rate:  [78-97] 91  (01/23 0540) Resp:  [16-18] 16  (01/23 0540) BP: (132-161)/(70-85) 147/78 mmHg (01/23 0540) SpO2:  [94 %-99 %] 96 % (01/23 0540)  Intake/Output from previous day:  Intake/Output Summary (Last 24 hours) at 04/09/11 1213 Last data filed at 04/09/11 0900  Gross per 24 hour  Intake    720 ml  Output   1975 ml  Net  -1255 ml    Intake/Output this shift: Total I/O In: -  Out: 1100 [Urine:1100]  Labs: Results for orders placed during the hospital encounter of 04/07/11  TYPE AND SCREEN      Component Value Range   ABO/RH(D) A POS     Antibody Screen NEG     Sample Expiration 04/10/2011    CBC      Component Value Range   WBC 5.3  4.0 - 10.5 (K/uL)   RBC 2.92 (*) 3.87 - 5.11 (MIL/uL)   Hemoglobin 9.4 (*) 12.0 - 15.0 (g/dL)   HCT 40.9 (*) 81.1 - 46.0 (%)   MCV 98.3  78.0 - 100.0 (fL)   MCH 32.2  26.0 - 34.0 (pg)   MCHC 32.8  30.0 - 36.0 (g/dL)   RDW 91.4  78.2 - 95.6 (%)   Platelets 160  150 - 400 (K/uL)  BASIC METABOLIC PANEL      Component Value Range   Sodium 137  135 - 145 (mEq/L)   Potassium 3.6  3.5 - 5.1 (mEq/L)   Chloride 104  96 - 112 (mEq/L)   CO2 27  19 - 32 (mEq/L)   Glucose, Bld 116 (*) 70 - 99 (mg/dL)   BUN 7  6 - 23 (mg/dL)   Creatinine, Ser 2.13  0.50 - 1.10 (mg/dL)   Calcium 8.2 (*) 8.4 - 10.5 (mg/dL)   GFR calc non Af Amer >90  >90 (mL/min)   GFR calc Af Amer >90  >90 (mL/min)  CBC      Component Value Range   WBC 6.7  4.0 - 10.5 (K/uL)   RBC 2.66 (*) 3.87 - 5.11 (MIL/uL)   Hemoglobin 8.7 (*) 12.0 - 15.0 (g/dL)   HCT 08.6 (*) 57.8 - 46.0 (%)   MCV 97.4  78.0 - 100.0 (fL)   MCH 32.7  26.0 - 34.0  (pg)   MCHC 33.6  30.0 - 36.0 (g/dL)   RDW 46.9  62.9 - 52.8 (%)   Platelets 160  150 - 400 (K/uL)  BASIC METABOLIC PANEL      Component Value Range   Sodium 133 (*) 135 - 145 (mEq/L)   Potassium 3.4 (*) 3.5 - 5.1 (mEq/L)   Chloride 100  96 - 112 (mEq/L)   CO2 27  19 - 32 (mEq/L)   Glucose, Bld 113 (*) 70 - 99 (mg/dL)   BUN 7  6 - 23 (mg/dL)   Creatinine, Ser 4.13  0.50 - 1.10 (mg/dL)   Calcium 8.1 (*) 8.4 - 10.5 (mg/dL)   GFR calc non Af Amer >90  >90 (mL/min)   GFR calc Af Amer >90  >90 (mL/min)    Exam: Neurovascular intact Sensation intact distally Incision - clean, dry, no drainage  Motor function intact - moving foot and toes well on exam.   Assessment/Plan: 2 Days Post-Op Procedure(s) (LRB): TOTAL KNEE ARTHROPLASTY (Right) Procedure(s) (LRB): TOTAL KNEE ARTHROPLASTY (Right) Past Medical History  Diagnosis Date  . Cancer 03-31-11    left breast 2011-radiation only  . Arthritis 03-31-11    osteoarthritis-knees and hands  . Ankle fracture, left 03-31-11    '09-soft cast only  . Hx of colonic polyps   . Cystocele     HX OF  . Rectocele   . Pneumonia    Principal Problem:  *OA (osteoarthritis) of knee   Advance diet Up with therapy D/C IV fluids Discharge home with home health Diet - regular Follow up - in 2 weeks Activity - WBAT Condition Upon Discharge - Good D/C Meds - Oxy, Robaxin DVT Prophylaxis - Xarelto  Protocol   PERKINS, ALEXZANDREW 04/09/2011, 12:13 PM

## 2011-04-09 NOTE — Progress Notes (Signed)
OT Screen Order received, chart reviewed. Pt had R knee replaced in 09 and has all necessary DME and prn A at home. Pt presents with no OT needs at this time. Will sign off.  Garrel Ridgel, OTR/L  Pager [phone] 04/09/2011

## 2011-04-17 DIAGNOSIS — E871 Hypo-osmolality and hyponatremia: Secondary | ICD-10-CM | POA: Diagnosis not present

## 2011-04-17 DIAGNOSIS — D62 Acute posthemorrhagic anemia: Secondary | ICD-10-CM | POA: Diagnosis not present

## 2011-04-17 DIAGNOSIS — E876 Hypokalemia: Secondary | ICD-10-CM | POA: Diagnosis not present

## 2011-04-17 NOTE — Discharge Summary (Signed)
Physician Discharge Summary   Patient ID: [name] MRN: [id number] DOB/AGE: 07/26/1940 71 y.o.  Admit date: 04/07/2011 Discharge date: 04/09/2011  Primary Diagnosis: Osteoarthritis Right Knee  Admission Diagnoses: Past Medical History  Diagnosis Date  . Cancer 03-31-11    left breast 2011-radiation only  . Arthritis 03-31-11    osteoarthritis-knees and hands  . Ankle fracture, left 03-31-11    '09-soft cast only  . Hx of colonic polyps   . Cystocele     HX OF  . Rectocele   . Pneumonia    Discharge Diagnoses:  Principal Problem:  *OA (osteoarthritis) of knee Active Problems:  Postop Acute blood loss anemia  Postop Hyponatremia  Postop Hypokalemia  Procedure: Procedure(s) (LRB): TOTAL KNEE ARTHROPLASTY (Right)   Consults: None  HPI: Marilyn Crosby is a 71 y.o. year year old female with end stage OA of her right knee with progressively worsening pain and dysfunction. She has constant pain, with activity and at rest and significant functional deficits with difficulties even with ADLs. She has had extensive non-op management including analgesics, injections of cortisone and viscosupplements, and home exercise program, but remains in significant pain with significant dysfunction.Radiographs show bone on bone arthritis medial and patellofemoral with varus deformity and tibial subluxation. She presents now for left Total Knee Arthroplasty.   Laboratory Data: Hospital Outpatient Visit on 03/31/2011  Component Date Value Range Status  . MRSA, PCR  03/31/2011 NEGATIVE  NEGATIVE Final  . Staphylococcus aureus  03/31/2011 NEGATIVE  NEGATIVE Final   Comment:                                 The Xpert SA Assay (FDA                          approved for NASAL specimens                          only), is one component of                          a comprehensive surveillance                          program.  It is not intended                          to diagnose infection nor to                            guide or monitor treatment.  . Prothrombin Time (seconds) 03/31/2011 14.0  11.6-15.2 Final  . INR  03/31/2011 1.06  0.00-1.49 Final  . aPTT (seconds) 03/31/2011 40* 24-37 Final   Comment:                                 IF BASELINE aPTT IS ELEVATED,                          SUGGEST PATIENT RISK ASSESSMENT                          BE  USED TO DETERMINE APPROPRIATE                          ANTICOAGULANT THERAPY.  . WBC (K/uL) 03/31/2011 5.0  4.0-10.5 Final  . RBC (MIL/uL) 03/31/2011 3.71* 3.87-5.11 Final  . Hemoglobin (g/dL) 16/12/9602 54.0* 98.1-19.1 Final  . HCT (%) 03/31/2011 36.3  36.0-46.0 Final  . MCV (fL) 03/31/2011 97.8  78.0-100.0 Final  . MCH (pg) 03/31/2011 31.5  26.0-34.0 Final  . MCHC (g/dL) 47/82/9562 13.0  86.5-78.4 Final  . RDW (%) 03/31/2011 12.4  11.5-15.5 Final  . Platelets (K/uL) 03/31/2011 233  150-400 Final  . Sodium (mEq/L) 03/31/2011 141  135-145 Final  . Potassium (mEq/L) 03/31/2011 4.3  3.5-5.1 Final  . Chloride (mEq/L) 03/31/2011 104  96-112 Final  . CO2 (mEq/L) 03/31/2011 28  19-32 Final  . Glucose, Bld (mg/dL) 69/62/9528 94  41-32 Final  . BUN (mg/dL) 44/03/270 15  5-36 Final  . Creatinine, Ser (mg/dL) 64/40/3474 2.59  5.63-8.75 Final  . Calcium (mg/dL) 64/33/2951 9.8  8.8-41.6 Final  . Total Protein (g/dL) 60/63/0160 7.4  1.0-9.3 Final  . Albumin (g/dL) 23/55/7322 3.8  0.2-5.4 Final  . AST (U/L) 03/31/2011 20  0-37 Final  . ALT (U/L) 03/31/2011 15  0-35 Final  . Alkaline Phosphatase (U/L) 03/31/2011 42  39-117 Final  . Total Bilirubin (mg/dL) 27/08/2374 0.3  2.8-3.1 Final  . GFR calc non Af Amer (mL/min) 03/31/2011 87* >90 Final  . GFR calc Af Amer (mL/min) 03/31/2011 >90  >90 Final   Comment:                                 The eGFR has been calculated                          using the CKD EPI equation.                          This calculation has not been                          validated in all clinical                           situations.                          eGFR's persistently                          <90 mL/min signify                          possible Chronic Kidney Disease.  . Color, Urine  03/31/2011 YELLOW  YELLOW Final  . APPearance  03/31/2011 CLEAR  CLEAR Final  . Specific Gravity, Urine  03/31/2011 1.011  1.005-1.030 Final  . pH  03/31/2011 6.5  5.0-8.0 Final  . Glucose, UA (mg/dL) 51/76/1607 NEGATIVE  NEGATIVE Final  . Hgb urine dipstick  03/31/2011 SMALL* NEGATIVE Final  . Bilirubin Urine  03/31/2011 NEGATIVE  NEGATIVE Final  . Ketones, ur (mg/dL) 37/12/6267 NEGATIVE  NEGATIVE Final  . Protein, ur (mg/dL) 48/54/6270 NEGATIVE  NEGATIVE Final  . Urobilinogen, UA (mg/dL) 35/00/9381  0.2  0.0-1.0 Final  . Nitrite  03/31/2011 NEGATIVE  NEGATIVE Final  . Leukocytes, UA  03/31/2011 SMALL* NEGATIVE Final  . Squamous Epithelial / LPF  03/31/2011 RARE  RARE Final  . WBC, UA (WBC/hpf) 03/31/2011 3-6  <3 Final  . RBC / HPF (RBC/hpf) 03/31/2011 3-6  <3 Final  . Bacteria, UA  03/31/2011 RARE  RARE Final   No results found for this basename: HGB:5 in the last 72 hours No results found for this basename: WBC:2,RBC:2,HCT:2,PLT:2 in the last 72 hours No results found for this basename: NA:2,K:2,CL:2,CO2:2,BUN:2,CREATININE:2,GLUCOSE:2,CALCIUM:2 in the last 72 hours No results found for this basename: LABPT:2,INR:2 in the last 72 hours  X-Rays:No results found.  EKG:No orders found for this or any previous visit.   Hospital Course: Patient was admitted to [facility] and taken to the OR and underwent the above state procedure without complications.  Patient tolerated the procedure well and was later transferred to the recovery room and then to the orthopaedic floor for postoperative care.  They were given PO and IV analgesics for pain control following their surgery.  They were given 24 hours of postoperative antibiotics and started on DVT prophylaxis.   PT and OT were ordered for total joint  protocol.  Discharge planning consulted to help with postop disposition and equipment needs.  Patient had a decent night on the evening of surgery but did have some soreness and started to get up with therapy on day one.  PCA Dilaudid was discontinued and they were weaned over to PO meds.  Hemovac drain was pulled without difficulty.  Continued to progress with therapy into day two.  Dressing was changed on day two and the incision was healing well. She did well patient was seen in rounds and was ready to go home on day 2.  Discharge Medications: Prior to Admission medications   Medication Sig Start Date End Date Taking? Authorizing Provider  levothyroxine (SYNTHROID, LEVOTHROID) 125 MCG tablet Take 112 mcg by mouth at bedtime. Dosage is per pt. On 03-31-11    Historical Provider, MD  methocarbamol (ROBAXIN) 500 MG tablet Take 1 tablet (500 mg total) by mouth every 6 (six) hours as needed. 04/09/11 04/19/11  Minela Bridgewater, PA  oxyCODONE (OXY IR/ROXICODONE) 5 MG immediate release tablet Take 1-2 tablets (5-10 mg total) by mouth every 4 (four) hours as needed for pain. 04/09/11 04/19/11  Everardo Voris Julien Girt, PA  rivaroxaban (XARELTO) 10 MG TABS tablet Take 1 tablet (10 mg total) by mouth daily with breakfast. After completing three weeks of Xarelto, patient may resume her home 81 mg Aspirin. 04/09/11   Vasiliy Mccarry, PA  tretinoin (RETIN-A) 0.01 % gel Apply 1 application topically every other day.      Historical Provider, MD    Diet: regular  Activity:WBAT  Follow-up:in 2 weeks  Disposition: Home Discharged Condition: good   Discharge Orders    Future Orders Please Complete By Expires   Diet - low sodium heart healthy      Call MD / Call 911      Comments:   If you experience chest pain or shortness of breath, CALL 911 and be transported to the hospital emergency room.  If you develope a fever above 101 F, pus (white drainage) or increased drainage or redness at the wound, or  calf pain, call your surgeon's office.   Constipation Prevention      Comments:   Drink plenty of fluids.  Prune juice may be helpful.  You may use a stool softener, such as Colace (over the counter) 100 mg twice a day.  Use MiraLax (over the counter) for constipation as needed.   Increase activity slowly as tolerated      Weight Bearing as taught in Physical Therapy      Comments:   Use a walker or crutches as instructed.   Discharge instructions      Comments:   Pick up stool softner and laxative for home. Do not submerge incision under water. May shower. Continue to use ice for pain and swelling from surgery.    Driving restrictions      Comments:   No driving   Lifting restrictions      Comments:   No lifting   TED hose      Comments:   Use stockings (TED hose) for 3 weeks on both leg(s).  You may remove them at night for sleeping.   Change dressing      Comments:   Change dressing daily with sterile 4 x 4 inch gauze dressing and apply TED hose.  You may clean the incision with alcohol prior to redressing.   Do not put a pillow under the knee. Place it under the heel.        Medication List  As of 04/17/2011 11:12 AM   STOP taking these medications         aspirin EC 81 MG tablet      BIOTIN 5000 PO      CALCIUM 600 + D PO      fish oil-omega-3 fatty acids 1000 MG capsule      ICAPS PO      mulitivitamin with minerals Tabs      vitamin B-12 500 MCG tablet      VITAMIN D-3 PO         TAKE these medications         levothyroxine 125 MCG tablet   Commonly known as: SYNTHROID, LEVOTHROID   Take 112 mcg by mouth at bedtime. Dosage is per pt. On 03-31-11      methocarbamol 500 MG tablet   Commonly known as: ROBAXIN   Take 1 tablet (500 mg total) by mouth every 6 (six) hours as needed.      oxyCODONE 5 MG immediate release tablet   Commonly known as: Oxy IR/ROXICODONE   Take 1-2 tablets (5-10 mg total) by mouth every 4 (four) hours as needed for pain.        rivaroxaban 10 MG Tabs tablet   Commonly known as: XARELTO   Take 1 tablet (10 mg total) by mouth daily with breakfast. After completing three weeks of Xarelto, patient may resume her home 81 mg Aspirin.      tretinoin 0.01 % gel   Commonly known as: RETIN-A   Apply 1 application topically every other day.           Follow-up Information    Follow up with Loanne Drilling, MD. Schedule an appointment as soon as possible for a visit in 2 weeks.   Contact information:   [facility] [address] [place] [address] [phone]          Signed: Patrica Duel 04/17/2011, 11:12 AM

## 2013-10-14 ENCOUNTER — Encounter: Payer: Self-pay | Admitting: Podiatrist

## 2013-10-14 ENCOUNTER — Ambulatory Visit (INDEPENDENT_AMBULATORY_CARE_PROVIDER_SITE_OTHER): Payer: Medicare Other | Admitting: Podiatrist

## 2013-10-14 ENCOUNTER — Ambulatory Visit (INDEPENDENT_AMBULATORY_CARE_PROVIDER_SITE_OTHER): Payer: Medicare Other

## 2013-10-14 VITALS — BP 125/67 | HR 74 | Resp 18

## 2013-10-14 DIAGNOSIS — M2041 Other hammer toe(s) (acquired), right foot: Secondary | ICD-10-CM

## 2013-10-14 DIAGNOSIS — M2040 Other hammer toe(s) (acquired), unspecified foot: Secondary | ICD-10-CM

## 2013-10-14 DIAGNOSIS — R52 Pain, unspecified: Secondary | ICD-10-CM

## 2013-10-14 NOTE — Progress Notes (Signed)
   Subjective:    Patient ID: KITANA GAGE, female    DOB: [birth year], 73 y.o.   MRN: [id number]  HPI MY 2ND TOE ON MY RIGHT FOOT AND HAS BEEN GOING ON FOR SEVERAL MONTHS AND DR Valentina Lucks DID SURGERY ON IT AND THROBS AND NO SWELLING AND HURTS WITH SHOES    Review of Systems  All other systems reviewed and are negative.      Objective:   Physical Exam Neurovascular status is unchanged. A shortened right second digit hammertoe from previous arthroplasty is noted. There is lateral deviation of the distal portion of the toe at the proximal interphalangeal joint causing a redundancy of skin and a corn in this area. We previously reduce the exostosis in the area however the angulation of the toe continues to cause rubbing and shear against her great toe causing irritation.       Assessment & Plan:  Hammertoe second digit right foot, shortened second digit right foot her previous hammertoe procedure Plan: Discussed options and alternatives. Discussed a straightening procedure with the use of a stabilization rod to try and straighten out the toe at the proximal interphalangeal joint without decreasing the length of the toe. I feel this will most easily be accomplished with a stabilization rod and minimal bone resection. We will also remove the redundant skin on the side of the toe as well. The patient was discussed the risks and benefits of surgery and would like to proceed. The consent form was signed and the patient's questions were encouraged and answered to the best of my ability. I will see her for her surgery on August 12

## 2013-10-14 NOTE — Patient Instructions (Signed)
Pre-Operative Instructions  Congratulations, you have decided to take an important step to improving your quality of life.  You can be assured that the doctors of [facility] will be with you every step of the way.  1. Plan to be at the surgery center/hospital at least 1 (one) hour prior to your scheduled time unless otherwise directed by the surgical center/hospital staff.  You must have a responsible adult accompany you, remain during the surgery and drive you home.  Make sure you have directions to the surgical center/hospital and know how to get there on time. 2. For hospital based surgery you will need to obtain a history and physical form from your family physician within 1 month prior to the date of surgery- we will give you a form for you primary physician.  3. We make every effort to accommodate the date you request for surgery.  There are however, times where surgery dates or times have to be moved.  We will contact you as soon as possible if a change in schedule is required.   4. No Aspirin/Ibuprofen for one week before surgery.  If you are on aspirin, any non-steroidal anti-inflammatory medications (Mobic, Aleve, Ibuprofen) you should stop taking it 7 days prior to your surgery.  You make take Tylenol  For pain prior to surgery.  5. Medications- If you are taking daily heart and blood pressure medications, seizure, reflux, allergy, asthma, anxiety, pain or diabetes medications, make sure the surgery center/hospital is aware before the day of surgery so they may notify you which medications to take or avoid the day of surgery. 6. No food or drink after midnight the night before surgery unless directed otherwise by surgical center/hospital staff. 7. No alcoholic beverages 24 hours prior to surgery.  No smoking 24 hours prior to or 24 hours after surgery. 8. Wear loose pants or shorts- loose enough to fit over bandages, boots, and casts. 9. No slip on shoes, sneakers are best. 10. Bring  your boot with you to the surgery center/hospital.  Also bring crutches or a walker if your physician has prescribed it for you.  If you do not have this equipment, it will be provided for you after surgery. 11. If you have not been contracted by the surgery center/hospital by the day before your surgery, call to confirm the date and time of your surgery. 12. Leave-time from work may vary depending on the type of surgery you have.  Appropriate arrangements should be made prior to surgery with your employer. 13. Prescriptions will be provided immediately following surgery by your doctor.  Have these filled as soon as possible after surgery and take the medication as directed. 14. Remove nail polish on the operative foot. 15. Wash the night before surgery.  The night before surgery wash the foot and leg well with the antibacterial soap provided and water paying special attention to beneath the toenails and in between the toes.  Rinse thoroughly with water and dry well with a towel.  Perform this wash unless told not to do so by your physician.  Enclosed: 1 Ice pack (please put in freezer the night before surgery)   1 Hibiclens skin cleaner   Pre-op Instructions  If you have any questions regarding the instructions, do not hesitate to call our office.  [place]: [address] [place], [place] [address] [phone]  [place]: [address] [place], [place] [address] [phone]  [place]: [address]  [place], [place] [address] [phone]  Dr. Richard   Tuchman DPM, Dr. Norman Regal DPM Dr. Richard Sikora DPM, Dr. M. Todd Hyatt DPM, Dr. Marq Rebello DPM 

## 2013-10-26 DIAGNOSIS — M898X9 Other specified disorders of bone, unspecified site: Secondary | ICD-10-CM

## 2013-10-28 ENCOUNTER — Telehealth: Payer: Self-pay

## 2013-10-28 NOTE — Telephone Encounter (Signed)
Spoke with pt regarding post operative status. She stated that she is doing well, managing her pain effectively and resting. Advised to remain in boot and keep sterile dressing dry. Watch for s/s of infection, ie n/v, fever

## 2013-11-03 ENCOUNTER — Encounter: Payer: Self-pay | Admitting: Podiatrist

## 2013-11-03 ENCOUNTER — Ambulatory Visit (INDEPENDENT_AMBULATORY_CARE_PROVIDER_SITE_OTHER): Payer: Medicare Other

## 2013-11-03 ENCOUNTER — Ambulatory Visit (INDEPENDENT_AMBULATORY_CARE_PROVIDER_SITE_OTHER): Payer: Medicare Other | Admitting: Podiatrist

## 2013-11-03 ENCOUNTER — Encounter: Payer: Medicare Other | Admitting: Podiatrist

## 2013-11-03 VITALS — BP 129/76 | HR 70 | Resp 13 | Ht 65.0 in | Wt 140.0 lb

## 2013-11-03 DIAGNOSIS — M2040 Other hammer toe(s) (acquired), unspecified foot: Secondary | ICD-10-CM

## 2013-11-03 DIAGNOSIS — Z9889 Other specified postprocedural states: Secondary | ICD-10-CM

## 2013-11-03 DIAGNOSIS — M2041 Other hammer toe(s) (acquired), right foot: Secondary | ICD-10-CM

## 2013-11-04 ENCOUNTER — Encounter: Payer: Medicare Other | Admitting: Podiatrist

## 2013-11-11 ENCOUNTER — Encounter: Payer: Self-pay | Admitting: Podiatrist

## 2013-11-11 ENCOUNTER — Ambulatory Visit (INDEPENDENT_AMBULATORY_CARE_PROVIDER_SITE_OTHER): Payer: Medicare Other | Admitting: Podiatrist

## 2013-11-11 VITALS — BP 128/78 | HR 65 | Resp 18

## 2013-11-11 DIAGNOSIS — Z9889 Other specified postprocedural states: Secondary | ICD-10-CM

## 2013-11-11 NOTE — Progress Notes (Signed)
Subjective: Patient presents today 2 week status post foot surgery of the right foot-- exostectomy of the first and second digit was performed. Date of surgery 10/26/2013. Patient denies nausea, vomiting, fevers, chills or night sweats. Denies calf pain or tenderness to the operative side.  Objective: Neurovascular status is intact with palpable pedal pulses DP and PT bilateral at 2+ out of 4. Neurological sensation is intact and unchanged as per prior to surgery. Excellent appearance of the postoperative foot is noted. Incision line is well coapted with sutures in place. No redness, swelling, no streaking, no sign of infection is present. Overall excellent appearance of the postoperative foot is seen clinically and radiographically.  Assessment: Status post exostosis hammertoe 2nd digit right and exostosis lateral hallux right  Plan: removed the sutures and redressed the foot in a dry sterile dressing. She'll be seen back in two weeks for follow up.

## 2013-11-11 NOTE — Progress Notes (Signed)
Subjective: Patient presents today1 week status post foot surgery of the right foot.  Date of surgery 10/26/2013. Patient denies nausea, vomiting, fevers, chills or night sweats.  Denies calf pain or tenderness to the operative side.  Objective:  Neurovascular status is intact with palpable pedal pulses DP and PT bilateral at 2+ out of 4. Neurological sensation is intact and unchanged as per prior to surgery. Excellent appearance of the postoperative foot is noted. Incision line is well coapted with sutures in place. No redness, swelling, no streaking, no sign of infection is present. Overall excellent appearance of the postoperative foot is seen clinically and radiographically.  Assessment: Status post exostosis hammertoe 2nd digit right and exostosis lateral hallux right  Plan:  Redressed the foot in a dry sterile dressing. She'll be seen back in one week for suture removal.

## 2013-12-02 ENCOUNTER — Ambulatory Visit (INDEPENDENT_AMBULATORY_CARE_PROVIDER_SITE_OTHER): Payer: Medicare Other

## 2013-12-02 ENCOUNTER — Ambulatory Visit (INDEPENDENT_AMBULATORY_CARE_PROVIDER_SITE_OTHER): Payer: Medicare Other | Admitting: Podiatrist

## 2013-12-02 VITALS — BP 138/75 | HR 72 | Resp 14 | Ht 65.0 in | Wt 142.0 lb

## 2013-12-02 DIAGNOSIS — M2040 Other hammer toe(s) (acquired), unspecified foot: Secondary | ICD-10-CM

## 2013-12-02 DIAGNOSIS — Z9889 Other specified postprocedural states: Secondary | ICD-10-CM

## 2013-12-02 NOTE — Progress Notes (Signed)
Subjective: Patient presents today 5 weeks status post exostectomy medial aspect right second toe and lateral aspect right first toe date of surgery 10/26/2013. She states she has a large scab over it and she keeps it covered and she does not want to remove this Herself. Overall she states that the pain is still present due to the scab.  Objective: Neurovascular status intact and unchanged. A large callus/scab has formed over the medial aspect of the incision site right second toe. And lateral aspect of the toe at the incision site. No redness, no swelling, no drainage, no sign of infection is present.  Assessment: Status post exostectomy with residual callus, scab  Plan: Remove the lesion with a 15 blade without complication. Applied Iodosorb and a dressing. Dispensed Iodosorb for her use at home. She'll be seen back in 3 weeks for recheck. She will stay out of her water classes until October. If any redness, swelling or sign of infection arise she will call.

## 2013-12-30 ENCOUNTER — Ambulatory Visit (INDEPENDENT_AMBULATORY_CARE_PROVIDER_SITE_OTHER): Payer: Medicare Other

## 2013-12-30 ENCOUNTER — Encounter: Payer: Self-pay | Admitting: Podiatrist

## 2013-12-30 ENCOUNTER — Ambulatory Visit (INDEPENDENT_AMBULATORY_CARE_PROVIDER_SITE_OTHER): Payer: Medicare Other | Admitting: Podiatrist

## 2013-12-30 VITALS — BP 128/69 | HR 71 | Resp 13

## 2013-12-30 DIAGNOSIS — M898X9 Other specified disorders of bone, unspecified site: Secondary | ICD-10-CM

## 2013-12-30 DIAGNOSIS — Z9889 Other specified postprocedural states: Secondary | ICD-10-CM

## 2014-01-02 NOTE — Progress Notes (Signed)
Subjective: Patient presents today 9 weeks status post exostectomy medial aspect right second toe and lateral aspect right first toe date of surgery 10/26/2013. She states she has a small callus over it and she keeps it covered and she does not want to remove this Herself.  She relates the pain has gotten better and she is hoping the callus will go away.  Objective: Neurovascular status intact and unchanged. A small callus has formed over the medial aspect of the incision site right second toe. And lateral aspect of the toe at the incision site. No redness, no swelling, no drainage, no sign of infection is present.  Assessment: Status post exostectomy with residual callus,  Plan: Remove the lesion with a 15 blade without complication. Applied Iodosorb and a dressing. Dispensed Iodosorb for her use at home. She'll be seen back as needed for follow up or if the callus returns.

## 2014-04-13 ENCOUNTER — Other Ambulatory Visit: Payer: Self-pay | Admitting: Dermatology

## 2014-05-11 ENCOUNTER — Other Ambulatory Visit: Payer: Self-pay | Admitting: Dermatology

## 2015-08-01 ENCOUNTER — Other Ambulatory Visit: Payer: Self-pay | Admitting: Family Medicine

## 2015-08-01 ENCOUNTER — Ambulatory Visit
Admission: RE | Admit: 2015-08-01 | Discharge: 2015-08-01 | Disposition: A | Discharge status: Home / Self Care | Payer: PRIVATE HEALTH INSURANCE | Source: Ambulatory Visit | Attending: Family Medicine | Admitting: Family Medicine

## 2015-08-01 DIAGNOSIS — S96911A Strain of unspecified muscle and tendon at ankle and foot level, right foot, initial encounter: Secondary | ICD-10-CM

## 2015-08-17 ENCOUNTER — Other Ambulatory Visit: Payer: Self-pay | Admitting: Gastroenterology

## 2015-08-17 DIAGNOSIS — R4702 Dysphasia: Secondary | ICD-10-CM

## 2015-08-23 ENCOUNTER — Ambulatory Visit
Admission: RE | Admit: 2015-08-23 | Discharge: 2015-08-23 | Disposition: A | Discharge status: Home / Self Care | Payer: Medicare Other | Source: Ambulatory Visit | Attending: Gastroenterology | Admitting: Gastroenterology

## 2015-08-23 DIAGNOSIS — R4702 Dysphasia: Secondary | ICD-10-CM

## 2016-01-10 ENCOUNTER — Ambulatory Visit
Admission: RE | Admit: 2016-01-10 | Discharge: 2016-01-10 | Disposition: A | Discharge status: Home / Self Care | Payer: Medicare Other | Source: Ambulatory Visit | Attending: Family Medicine | Admitting: Family Medicine

## 2016-01-10 ENCOUNTER — Other Ambulatory Visit: Payer: Self-pay | Admitting: Family Medicine

## 2016-01-10 DIAGNOSIS — R05 Cough: Secondary | ICD-10-CM

## 2016-01-10 DIAGNOSIS — R053 Chronic cough: Secondary | ICD-10-CM

## 2016-08-28 ENCOUNTER — Other Ambulatory Visit: Payer: Self-pay | Admitting: Plastic Surgery

## 2017-04-16 IMAGING — CR DG ANKLE 2V *R*
2 series · 2 of 2 positions shown · non-contrast
Comparison: None.

CLINICAL DATA: Inversion injury 1 month ago with persistent pain,
initial encounter

EXAM:
RIGHT ANKLE - 2 VIEW

[x ankle ap right]
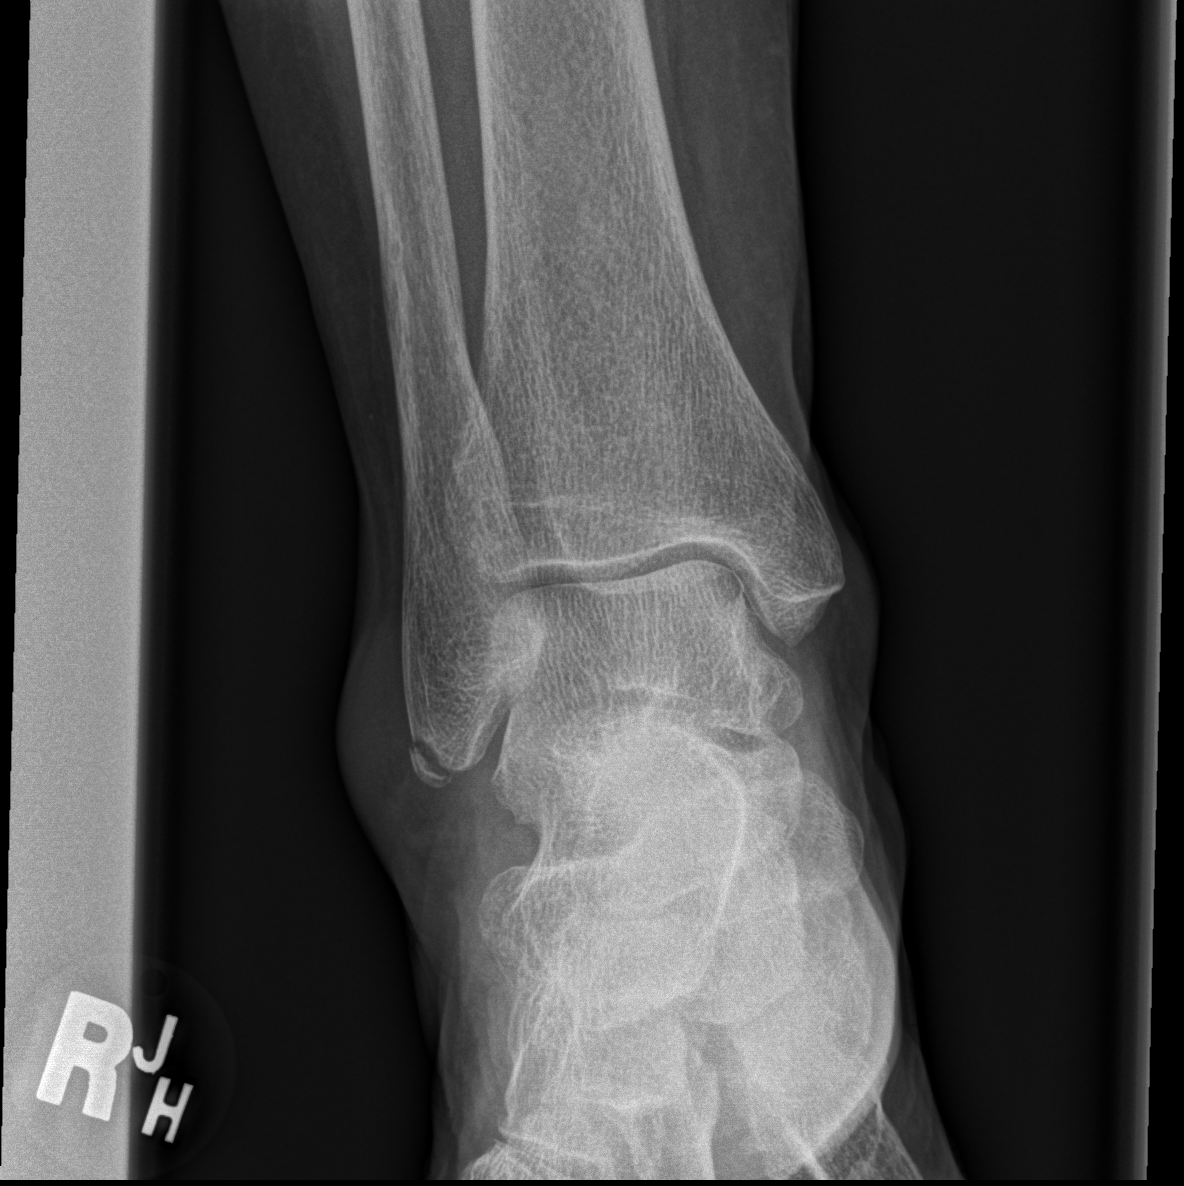

[x ankle lat right]
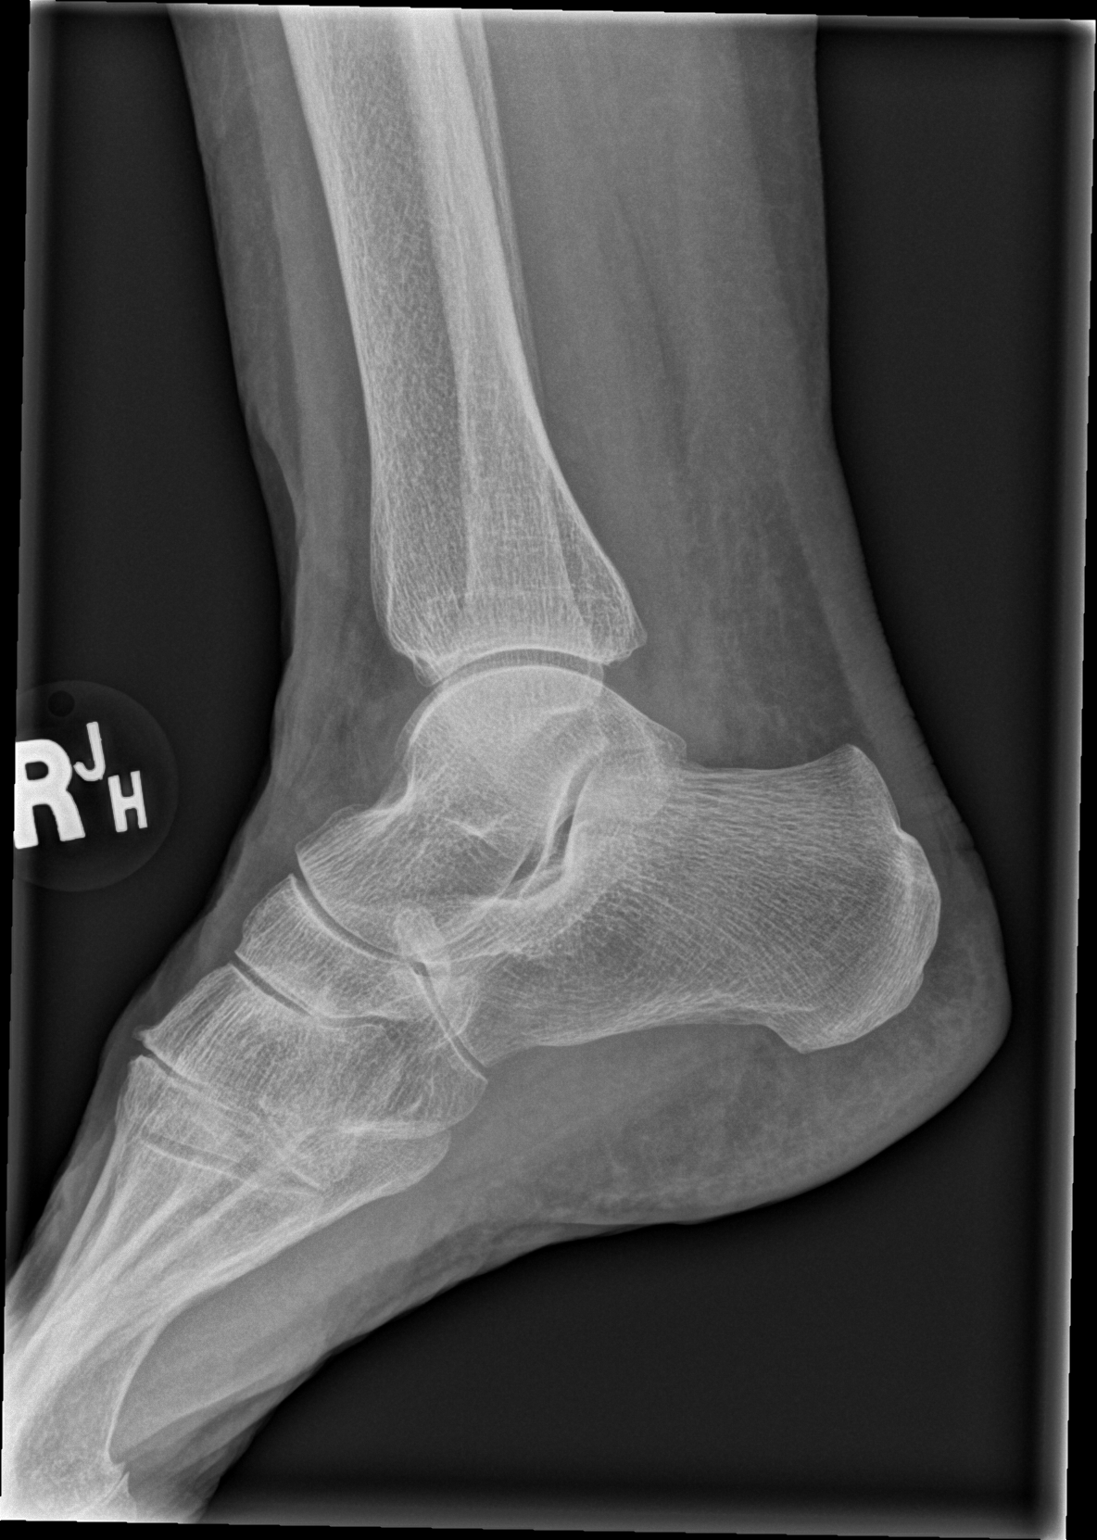

[2 of 2 positions shown; findings below may reference images not displayed]

FINDINGS: There is a small avulsion fracture from the distal aspect of the
fibula. Generalized soft tissue swelling is noted laterally. No
other focal abnormality is seen.
IMPRESSION: Avulsion fracture from the distal fibula.

## 2018-01-27 LAB — GLUCOSE, POCT (MANUAL RESULT ENTRY): POC Glucose: 123 mg/dl — AB (ref 70–99)

## 2019-07-25 DIAGNOSIS — M79605 Pain in left leg: Secondary | ICD-10-CM | POA: Diagnosis not present

## 2019-07-25 DIAGNOSIS — L84 Corns and callosities: Secondary | ICD-10-CM | POA: Diagnosis not present

## 2019-07-25 DIAGNOSIS — I8392 Asymptomatic varicose veins of left lower extremity: Secondary | ICD-10-CM | POA: Diagnosis not present

## 2019-08-11 DIAGNOSIS — M79672 Pain in left foot: Secondary | ICD-10-CM | POA: Diagnosis not present

## 2019-08-17 DIAGNOSIS — S93115D Dislocation of interphalangeal joint of left lesser toe(s), subsequent encounter: Secondary | ICD-10-CM | POA: Diagnosis not present

## 2019-08-23 DIAGNOSIS — S93115A Dislocation of interphalangeal joint of left lesser toe(s), initial encounter: Secondary | ICD-10-CM | POA: Diagnosis not present

## 2019-08-23 DIAGNOSIS — S93115D Dislocation of interphalangeal joint of left lesser toe(s), subsequent encounter: Secondary | ICD-10-CM | POA: Diagnosis not present

## 2019-08-30 ENCOUNTER — Ambulatory Visit: Payer: Medicare PPO | Admitting: Sports Medicine

## 2019-09-01 DIAGNOSIS — M810 Age-related osteoporosis without current pathological fracture: Secondary | ICD-10-CM | POA: Diagnosis not present

## 2019-09-02 ENCOUNTER — Other Ambulatory Visit: Payer: Self-pay | Admitting: *Deleted

## 2019-09-02 DIAGNOSIS — I83893 Varicose veins of bilateral lower extremities with other complications: Secondary | ICD-10-CM

## 2019-09-12 ENCOUNTER — Ambulatory Visit: Payer: Medicare PPO | Admitting: Physician Assistant

## 2019-09-12 ENCOUNTER — Ambulatory Visit (HOSPITAL_COMMUNITY)
Admission: RE | Admit: 2019-09-12 | Discharge: 2019-09-12 | Disposition: A | Discharge status: Home / Self Care | Payer: Medicare PPO | Source: Ambulatory Visit | Attending: Surgery | Admitting: Surgery

## 2019-09-12 ENCOUNTER — Other Ambulatory Visit: Payer: Self-pay

## 2019-09-12 VITALS — BP 149/93 | HR 71 | Temp 96.7°F | Resp 20 | Ht 65.0 in | Wt 142.8 lb

## 2019-09-12 DIAGNOSIS — I872 Venous insufficiency (chronic) (peripheral): Secondary | ICD-10-CM | POA: Diagnosis not present

## 2019-09-12 DIAGNOSIS — I83893 Varicose veins of bilateral lower extremities with other complications: Secondary | ICD-10-CM

## 2019-09-12 DIAGNOSIS — I8393 Asymptomatic varicose veins of bilateral lower extremities: Secondary | ICD-10-CM

## 2019-09-12 NOTE — Progress Notes (Signed)
Requested by:  Carol Ada, [address],  [place] [address]  Reason for consultation: painful varicose veins of left leg with areas of calcification   History of Present Illness   Marilyn Crosby is a 79 y.o. (November 13, 1940) female who presents for evaluation of painful varicose veins in her left lower extremity with areas of calcification. She states this has been present for several years now. She has history of vein injections in the left leg in 2017 at [facility]. She said after she had some improvement but then she noticed a firm "knot" in area in the distal left leg where they treated that has been tender and sore ever since. She feels that also more recently she has noticed more prominent veins in her left leg and aching. She otherwise denies any swelling, itching, burning, tiredness, heaviness, or ulceration.  She has worn compression stockings previously after she had her injections but she stopped wearing them after about 3 months post procedure. She very intermittently will elevate her legs. She recently broke her left 4th toe so she has been elevating her legs secondary to recent surgery 2 weeks ago but normally she does not elevate a lot. She otherwise says she is pretty active and exercises regularly  She has no hx of DVT. She has no family history of DVT or venous issues  Venous symptoms include: aching and tenderness in left leg Onset/duration: >5 years Occupation:  retired Aggravating factors: sitting, standing Alleviating factors: elevation Compression:  yes Helps:  Not significantly Pain medications: none Previous vein procedures:  Injections of LLE History of DVT: No  Past Medical History:  Diagnosis Date  . Ankle fracture, left 03-31-11   '09-soft cast only  . Arthritis 03-31-11   osteoarthritis-knees and hands  . Cancer ([place]) 03-31-11   left breast 2011-radiation only  . Cystocele    HX OF  . Hx of colonic  polyps   . Pneumonia   . Rectocele     Past Surgical History:  Procedure Laterality Date  . ABDOMINAL HYSTERECTOMY  03-31-11   2010-Prolapse uterus  . BREAST SURGERY  03-31-11   Lumpectomy-dx. 2011 left breast cancer-radiation  . BUNIONECTOMY  03-31-11   bil.'96/'99  . CATARACT EXTRACTION W/ INTRAOCULAR LENS IMPLANT  03-31-11   bil.'95 and rt.eye again '05  . COLONOSCOPY W/ POLYPECTOMY  09/21/2000  . GANGLION CYST EXCISION  03-31-11   left wrist  . HAMMER TOE SURGERY  03-31-11   bil.   Marland Kitchen JOINT REPLACEMENT  03-31-11   LTKA('09), now rt. planned  . NASAL SEPTUM SURGERY  03-31-11   deviated septum '75  . TONSILLECTOMY  1947   child  . TOTAL KNEE ARTHROPLASTY  04/07/2011   Procedure: TOTAL KNEE ARTHROPLASTY;  Surgeon: Gearlean Alf, MD;  Location: [facility];  Service: Orthopedics;  Laterality: Right;    Social History   Socioeconomic History  . Marital status: Married    Spouse name: Not on file  . Number of children: Not on file  . Years of education: Not on file  . Highest education level: Not on file  Occupational History  . Not on file  Tobacco Use  . Smoking status: Never Smoker  . Smokeless tobacco: Never Used  Substance and Sexual Activity  . Alcohol use: No  . Drug use: No  . Sexual activity: Never  Other Topics Concern  . Not on file  Social History Narrative  . Not  on file   Social Determinants of Health   Financial Resource Strain:   . Difficulty of Paying Living Expenses:   Food Insecurity:   . Worried About Charity fundraiser in the Last Year:   . Arboriculturist in the Last Year:   Transportation Needs:   . Film/video editor (Medical):   Marland Kitchen Lack of Transportation (Non-Medical):   Physical Activity:   . Days of Exercise per Week:   . Minutes of Exercise per Session:   Stress:   . Feeling of Stress :   Social Connections:   . Frequency of Communication with Friends and Family:   . Frequency of Social Gatherings with Friends and Family:   .  Attends Religious Services:   . Active Member of Clubs or Organizations:   . Attends Archivist Meetings:   Marland Kitchen Marital Status:   Intimate Partner Violence:   . Fear of Current or Ex-Partner:   . Emotionally Abused:   Marland Kitchen Physically Abused:   . Sexually Abused:    No family history on file.  Current Outpatient Medications  Medication Sig Dispense Refill  . denosumab (PROLIA) 60 MG/ML SOLN injection Inject 60 mg into the skin every 6 (six) months. Administer in upper arm, thigh, or abdomen    . latanoprost (XALATAN) 0.005 % ophthalmic solution latanoprost 0.005 % eye drops  INSTILL 1 DROP IN LEFT EYE EVERY NIGHT    . levothyroxine (SYNTHROID, LEVOTHROID) 125 MCG tablet Take 0.1 mcg by mouth at bedtime. Dosage is 161mcg per pt. On 03-31-11    . TAZORAC 0.1 % cream     . tretinoin (RETIN-A) 0.01 % gel Apply 1 application topically every other day.       No current facility-administered medications for this visit.    Allergies  Allergen Reactions  . Morphine And Related Itching    REVIEW OF SYSTEMS (negative unless checked):   Cardiac:  []  Chest pain or chest pressure? []  Shortness of breath upon activity? []  Shortness of breath when lying flat? []  Irregular heart rhythm?  Vascular:  []  Pain in calf, thigh, or hip brought on by walking? []  Pain in feet at night that wakes you up from your sleep? []  Blood clot in your veins? []  Leg swelling?  Pulmonary:  []  Oxygen at home? []  Productive cough? []  Wheezing?  Neurologic:  []  Sudden weakness in arms or legs? []  Sudden numbness in arms or legs? []  Sudden onset of difficult speaking or slurred speech? []  Temporary loss of vision in one eye? []  Problems with dizziness?  Gastrointestinal:  []  Blood in stool? []  Vomited blood?  Genitourinary:  []  Burning when urinating? []  Blood in urine?  Psychiatric:  []  Major depression  Hematologic:  []  Bleeding problems? []  Problems with blood  clotting?  Dermatologic:  []  Rashes or ulcers?  Constitutional:  []  Fever or chills?  Ear/Nose/Throat:  []  Change in hearing? []  Nose bleeds? []  Sore throat?  Musculoskeletal:  []  Back pain? []  Joint pain? []  Muscle pain?   Physical Examination     Vitals:   09/12/19 1333  BP: (!) 149/93  Pulse: 71  Resp: 20  Temp: (!) 96.7 F (35.9 C)  TempSrc: Temporal  SpO2: 100%  Weight: 142 lb 12.8 oz (64.8 kg)  Height: 5\' 5"  (1.651 m)   Body mass index is 23.76 kg/m.  General:  WDWN in NAD; vital signs documented above Gait: Normal HENT: WNL, normocephalic Pulmonary: normal non-labored breathing without wheezing Cardiac:  regular HR, without  Murmurs without carotid bruit Vascular Exam/Pulses:2+ femoral, dorsalis pedis and posterior tibial pulse Extremities: with varicose veins of the medial posteiror left leg,one area of thrombosed varicose vein with slight tenderness to palpation, no overlying skin changes.  without reticular veins, without edema, without stasis pigmentation, without lipodermatosclerosis, without ulcers Musculoskeletal: no muscle wasting or atrophy  Neurologic: A&O X 3;  No focal weakness or paresthesias are detected Psychiatric:  The pt has Normal affect.  Non-invasive Vascular Imaging   BLE Venous Insufficiency Duplex (09/12/19):   LLE:  No DVT and SVT  GSV reflux at [facility] and distal thigh to proximal calf  GSV diameter >0.4 cm  SSV reflux in the mid calf  Deep venous reflux in CFV, femoral vein and popliteal vein   Medical Decision Making   Marilyn Crosby is a 79 y.o. female who presents with: LLE chronic venous insufficiency with varicose veins with complications. She has significant venous insufficiency in the left leg in both the deep and superficial systems. She also has an area in the distal medial posterior left leg where she had prior injections that she has thrombosed varicose vein. 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 I discussed with her possible treatment of this varicose vein in the future if it continues to give her trouble with stab phlebectomy   Based on the patient's history and examination, I recommend conservative therapy of compression stockings, elevation and exercise. She can use NSAIDs and warm compresses as needed for local pain relief  I discussed with the patient the use of her 20-30 mm thigh high compression stockings and need for 3 month trial of such.  The patient will follow up in 3 months with Dr. Oneida Alar or Dr. Scot Dock  Thank you for allowing [place] to participate in this patient's care.   Karoline Caldwell, PA-C [facility] Office: [phone]  09/12/2019, 1:39 PM  Clinic MD: Trula Slade

## 2019-09-27 ENCOUNTER — Ambulatory Visit: Payer: Medicare PPO | Admitting: Sports Medicine

## 2019-10-05 DIAGNOSIS — L84 Corns and callosities: Secondary | ICD-10-CM | POA: Diagnosis not present

## 2019-10-05 DIAGNOSIS — M21611 Bunion of right foot: Secondary | ICD-10-CM | POA: Diagnosis not present

## 2019-10-05 DIAGNOSIS — M79671 Pain in right foot: Secondary | ICD-10-CM | POA: Diagnosis not present

## 2019-10-05 DIAGNOSIS — M79672 Pain in left foot: Secondary | ICD-10-CM | POA: Diagnosis not present

## 2019-10-20 DIAGNOSIS — L910 Hypertrophic scar: Secondary | ICD-10-CM | POA: Diagnosis not present

## 2019-10-20 DIAGNOSIS — L814 Other melanin hyperpigmentation: Secondary | ICD-10-CM | POA: Diagnosis not present

## 2019-10-20 DIAGNOSIS — D2271 Melanocytic nevi of right lower limb, including hip: Secondary | ICD-10-CM | POA: Diagnosis not present

## 2019-10-20 DIAGNOSIS — L821 Other seborrheic keratosis: Secondary | ICD-10-CM | POA: Diagnosis not present

## 2019-10-20 DIAGNOSIS — D2261 Melanocytic nevi of right upper limb, including shoulder: Secondary | ICD-10-CM | POA: Diagnosis not present

## 2019-10-20 DIAGNOSIS — Z85828 Personal history of other malignant neoplasm of skin: Secondary | ICD-10-CM | POA: Diagnosis not present

## 2019-10-20 DIAGNOSIS — D2262 Melanocytic nevi of left upper limb, including shoulder: Secondary | ICD-10-CM | POA: Diagnosis not present

## 2019-10-20 DIAGNOSIS — D1801 Hemangioma of skin and subcutaneous tissue: Secondary | ICD-10-CM | POA: Diagnosis not present

## 2019-11-08 DIAGNOSIS — Z1231 Encounter for screening mammogram for malignant neoplasm of breast: Secondary | ICD-10-CM | POA: Diagnosis not present

## 2019-11-08 DIAGNOSIS — Z780 Asymptomatic menopausal state: Secondary | ICD-10-CM | POA: Diagnosis not present

## 2019-11-08 DIAGNOSIS — M8589 Other specified disorders of bone density and structure, multiple sites: Secondary | ICD-10-CM | POA: Diagnosis not present

## 2019-12-13 ENCOUNTER — Ambulatory Visit: Payer: Medicare PPO | Attending: Internal Medicine

## 2019-12-13 DIAGNOSIS — Z23 Encounter for immunization: Secondary | ICD-10-CM

## 2019-12-13 NOTE — Progress Notes (Signed)
   Covid-19 Vaccination Clinic  Name:  [name]    MRN: [id number] DOB: 1941-03-09  12/13/2019  [name] was observed post Covid-19 immunization for 15 minutes without incident. She was provided with Vaccine Information Sheet and instruction to access the V-Safe system.   [name] was instructed to call 911 with any severe reactions post vaccine: Marland Kitchen Difficulty breathing  . Swelling of face and throat  . A fast heartbeat  . A bad rash all over body  . Dizziness and weakness

## 2019-12-15 ENCOUNTER — Encounter: Payer: Self-pay | Admitting: Vascular Surgery

## 2019-12-15 ENCOUNTER — Other Ambulatory Visit: Payer: Self-pay

## 2019-12-15 ENCOUNTER — Ambulatory Visit (INDEPENDENT_AMBULATORY_CARE_PROVIDER_SITE_OTHER): Payer: Medicare PPO | Admitting: Vascular Surgery

## 2019-12-15 VITALS — BP 154/80 | HR 73 | Temp 98.2°F | Resp 16 | Ht 65.0 in | Wt 144.6 lb

## 2019-12-15 DIAGNOSIS — I872 Venous insufficiency (chronic) (peripheral): Secondary | ICD-10-CM

## 2019-12-15 DIAGNOSIS — I83893 Varicose veins of bilateral lower extremities with other complications: Secondary | ICD-10-CM

## 2019-12-15 NOTE — Progress Notes (Signed)
REASON FOR VISIT:   Painful varicose veins.  MEDICAL ISSUES:   CHRONIC VENOUS INSUFFICIENCY: This patient has CEAP C2 venous disease.  She has deep venous reflux and superficial venous reflux with painful varicose veins in the left calf.  We have discussed the importance of intermittent leg elevation and the proper positioning for this.  In addition she will continue to wear her compression stockings.  I have encouraged her to avoid prolonged sitting and standing.  We discussed the importance of exercise specifically walking and water aerobics.  If her symptoms do not improve I think she would be a candidate for laser ablation of the left great saphenous vein to the junction of the mid to distal thigh in addition to 10-20 stab phlebectomies of the varicosities in her posterior left calf.  She will call if her symptoms worsen.  HPI:   Marilyn Crosby is a pleasant 79 y.o. female who was seen by Karoline Caldwell, PA on 09/12/2019 with painful varicose veins of the left lower extremity.  She was fitted for thigh-high compression stockings with a gradient of 20 to 30 mmHg, encouraged to elevate her legs, and take ibuprofen as needed for pain.  She comes in for 81-month follow-up visit.  On my history the patient has had a long history of varicose veins for many years.  Recently these have become more painful.  The varicose veins in her posterior left calf.  She experiences aching and heaviness in her left leg which is aggravated by standing and sitting and relieved with elevation.  She has been wearing her thigh-high compression stockings with a gradient of 20 to 30 mmHg faithfully.  She states that these have not significantly helped her symptoms.  She has no previous history of DVT.  She did have some sclerotherapy in 2016 locally and sounds like she developed some phlebitis just above the ankle after that.  She has had no late laser procedures.   Past Medical History:  Diagnosis Date  . Ankle  fracture, left 03-31-11   '09-soft cast only  . Arthritis 03-31-11   osteoarthritis-knees and hands  . Cancer ([place]) 03-31-11   left breast 2011-radiation only  . Cystocele    HX OF  . Hx of colonic polyps   . Pneumonia   . Rectocele     History reviewed. No pertinent family history.  SOCIAL HISTORY: Social History   Tobacco Use  . Smoking status: Never Smoker  . Smokeless tobacco: Never Used  Substance Use Topics  . Alcohol use: No    Allergies  Allergen Reactions  . Morphine And Related Itching    Current Outpatient Medications  Medication Sig Dispense Refill  . denosumab (PROLIA) 60 MG/ML SOLN injection Inject 60 mg into the skin every 6 (six) months. Administer in upper arm, thigh, or abdomen    . latanoprost (XALATAN) 0.005 % ophthalmic solution latanoprost 0.005 % eye drops  INSTILL 1 DROP IN LEFT EYE EVERY NIGHT    . levothyroxine (SYNTHROID, LEVOTHROID) 125 MCG tablet Take 0.1 mcg by mouth at bedtime. Dosage is 1108mcg per pt. On 03-31-11    . TAZORAC 0.1 % cream     . tretinoin (RETIN-A) 0.01 % gel Apply 1 application topically every other day.       No current facility-administered medications for this visit.    REVIEW OF SYSTEMS:  [X]  denotes positive finding, [ ]  denotes negative finding Cardiac  Comments:  Chest pain or chest pressure:  Shortness of breath upon exertion:    Short of breath when lying flat:    Irregular heart rhythm:        Vascular    Pain in calf, thigh, or hip brought on by ambulation:    Pain in feet at night that wakes you up from your sleep:     Blood clot in your veins:    Leg swelling:  x       Pulmonary    Oxygen at home:    Productive cough:     Wheezing:         Neurologic    Sudden weakness in arms or legs:     Sudden numbness in arms or legs:     Sudden onset of difficulty speaking or slurred speech:    Temporary loss of vision in one eye:     Problems with dizziness:         Gastrointestinal    Blood in stool:      Vomited blood:         Genitourinary    Burning when urinating:     Blood in urine:        Psychiatric    Major depression:         Hematologic    Bleeding problems:    Problems with blood clotting too easily:        Skin    Rashes or ulcers:        Constitutional    Fever or chills:     PHYSICAL EXAM:   Vitals:   12/15/19 1320  BP: (!) 154/80  Pulse: 73  Resp: 16  Temp: 98.2 F (36.8 C)  TempSrc: Temporal  SpO2: 99%  Weight: 144 lb 9.6 oz (65.6 kg)  Height: 5\' 5"  (1.651 m)    GENERAL: The patient is a well-nourished female, in no acute distress. The vital signs are documented above. CARDIAC: There is a regular rate and rhythm.  VASCULAR: I do not detect carotid bruits. She has palpable pedal pulses. She has an enlarged varicose veins in her posterior left calf.  These are documented below. I did look at her great saphenous vein myself with the SonoSite.  This is dilated in the proximal and mid thigh with significant reflux.  There is a medial branch but this does not appear to be an issue.  The vein could likely be cannulated at the junction of the mid and distal thigh on the left.    PULMONARY: There is good air exchange bilaterally without wheezing or rales. ABDOMEN: Soft and non-tender with normal pitched bowel sounds.  MUSCULOSKELETAL: There are no major deformities or cyanosis. NEUROLOGIC: No focal weakness or paresthesias are detected. SKIN: There are no ulcers or rashes noted. PSYCHIATRIC: The patient has a normal affect.  DATA:    DUPLEX: I have reviewed the venous duplex scan that was done on 09/12/2019.  On the left side there was no evidence of DVT or superficial venous thrombosis.  There was deep venous reflux involving the common femoral vein, femoral vein, and popliteal vein.  There was superficial venous reflux in the left great saphenous vein with diameters of the vein ranging from 0.45-0.49 cm.  Marilyn Crosby [facility] [phone]

## 2020-01-16 DIAGNOSIS — Z20828 Contact with and (suspected) exposure to other viral communicable diseases: Secondary | ICD-10-CM | POA: Diagnosis not present

## 2020-01-25 DIAGNOSIS — Z961 Presence of intraocular lens: Secondary | ICD-10-CM | POA: Diagnosis not present

## 2020-01-25 DIAGNOSIS — H31012 Macula scars of posterior pole (postinflammatory) (post-traumatic), left eye: Secondary | ICD-10-CM | POA: Diagnosis not present

## 2020-03-05 DIAGNOSIS — M810 Age-related osteoporosis without current pathological fracture: Secondary | ICD-10-CM | POA: Diagnosis not present

## 2020-04-23 DIAGNOSIS — E785 Hyperlipidemia, unspecified: Secondary | ICD-10-CM | POA: Diagnosis not present

## 2020-04-23 DIAGNOSIS — E039 Hypothyroidism, unspecified: Secondary | ICD-10-CM | POA: Diagnosis not present

## 2020-04-30 DIAGNOSIS — E785 Hyperlipidemia, unspecified: Secondary | ICD-10-CM | POA: Diagnosis not present

## 2020-04-30 DIAGNOSIS — Z0000 Encounter for general adult medical examination without abnormal findings: Secondary | ICD-10-CM | POA: Diagnosis not present

## 2020-04-30 DIAGNOSIS — M810 Age-related osteoporosis without current pathological fracture: Secondary | ICD-10-CM | POA: Diagnosis not present

## 2020-04-30 DIAGNOSIS — E039 Hypothyroidism, unspecified: Secondary | ICD-10-CM | POA: Diagnosis not present

## 2020-04-30 DIAGNOSIS — Z1389 Encounter for screening for other disorder: Secondary | ICD-10-CM | POA: Diagnosis not present

## 2020-04-30 DIAGNOSIS — Z23 Encounter for immunization: Secondary | ICD-10-CM | POA: Diagnosis not present

## 2020-05-17 ENCOUNTER — Encounter (INDEPENDENT_AMBULATORY_CARE_PROVIDER_SITE_OTHER): Payer: Medicare PPO | Admitting: Ophthalmology

## 2020-05-23 ENCOUNTER — Encounter (INDEPENDENT_AMBULATORY_CARE_PROVIDER_SITE_OTHER): Payer: Medicare PPO | Admitting: Ophthalmology

## 2020-05-28 ENCOUNTER — Ambulatory Visit (INDEPENDENT_AMBULATORY_CARE_PROVIDER_SITE_OTHER): Payer: Medicare PPO | Admitting: Ophthalmology

## 2020-05-28 ENCOUNTER — Other Ambulatory Visit: Payer: Self-pay

## 2020-05-28 ENCOUNTER — Encounter (INDEPENDENT_AMBULATORY_CARE_PROVIDER_SITE_OTHER): Payer: Self-pay | Admitting: Ophthalmology

## 2020-05-28 DIAGNOSIS — H353111 Nonexudative age-related macular degeneration, right eye, early dry stage: Secondary | ICD-10-CM

## 2020-05-28 DIAGNOSIS — H353124 Nonexudative age-related macular degeneration, left eye, advanced atrophic with subfoveal involvement: Secondary | ICD-10-CM

## 2020-05-28 DIAGNOSIS — H15832 Staphyloma posticum, left eye: Secondary | ICD-10-CM

## 2020-05-28 DIAGNOSIS — H401131 Primary open-angle glaucoma, bilateral, mild stage: Secondary | ICD-10-CM

## 2020-05-28 DIAGNOSIS — H3530 Unspecified macular degeneration: Secondary | ICD-10-CM | POA: Diagnosis not present

## 2020-05-28 DIAGNOSIS — Z9889 Other specified postprocedural states: Secondary | ICD-10-CM | POA: Diagnosis not present

## 2020-05-28 NOTE — Assessment & Plan Note (Signed)
Stable OS 

## 2020-05-28 NOTE — Assessment & Plan Note (Signed)
Stable OU 

## 2020-05-28 NOTE — Progress Notes (Signed)
05/28/2020     CHIEF COMPLAINT Patient presents for Retina Follow Up (1 Year f\u OU. OCT/Pt states vision is about the same. Pt denies new floaters and FOL. Pt using Latanoprost OS 1 gtt at bedtime)   HISTORY OF PRESENT ILLNESS: Marilyn Crosby is a 80 y.o. female who presents to the clinic today for:   HPI    Retina Follow Up    Patient presents with  Dry AMD.  In both eyes.  Severity is moderate.  Duration of 1 year.  Since onset it is stable.  I, the attending physician,  performed the HPI with the patient and updated documentation appropriately. Additional comments: 1 Year f\u OU. OCT Pt states vision is about the same. Pt denies new floaters and FOL. Pt using Latanoprost OS 1 gtt at bedtime       Last edited by Tilda Franco on 05/28/2020  8:18 AM. (History)      Referring physician: Carol Ada, MD [address],  [place] [address]  HISTORICAL INFORMATION:   Selected notes from the MEDICAL RECORD NUMBER       CURRENT MEDICATIONS: Current Outpatient Medications (Ophthalmic Drugs)  Medication Sig  . latanoprost (XALATAN) 0.005 % ophthalmic solution latanoprost 0.005 % eye drops  INSTILL 1 DROP IN LEFT EYE EVERY NIGHT   No current facility-administered medications for this visit. (Ophthalmic Drugs)   Current Outpatient Medications (Other)  Medication Sig  . denosumab (PROLIA) 60 MG/ML SOLN injection Inject 60 mg into the skin every 6 (six) months. Administer in upper arm, thigh, or abdomen  . levothyroxine (SYNTHROID, LEVOTHROID) 125 MCG tablet Take 0.1 mcg by mouth at bedtime. Dosage is 128mcg per pt. On 03-31-11  . TAZORAC 0.1 % cream   . tretinoin (RETIN-A) 0.01 % gel Apply 1 application topically every other day.     No current facility-administered medications for this visit. (Other)      REVIEW OF SYSTEMS:    ALLERGIES Allergies  Allergen Reactions  . Morphine And Related Itching    PAST MEDICAL HISTORY Past Medical  History:  Diagnosis Date  . Ankle fracture, left 03-31-11   '09-soft cast only  . Arthritis 03-31-11   osteoarthritis-knees and hands  . Cancer ([place]) 03-31-11   left breast 2011-radiation only  . Cystocele    HX OF  . Hx of colonic polyps   . Pneumonia   . Rectocele    Past Surgical History:  Procedure Laterality Date  . ABDOMINAL HYSTERECTOMY  03-31-11   2010-Prolapse uterus  . BREAST SURGERY  03-31-11   Lumpectomy-dx. 2011 left breast cancer-radiation  . BUNIONECTOMY  03-31-11   bil.'96/'99  . CATARACT EXTRACTION W/ INTRAOCULAR LENS IMPLANT  03-31-11   bil.'95 and rt.eye again '05  . COLONOSCOPY W/ POLYPECTOMY  09/21/2000  . GANGLION CYST EXCISION  03-31-11   left wrist  . HAMMER TOE SURGERY  03-31-11   bil.   Marland Kitchen JOINT REPLACEMENT  03-31-11   LTKA('09), now rt. planned  . NASAL SEPTUM SURGERY  03-31-11   deviated septum '75  . TONSILLECTOMY  1947   child  . TOTAL KNEE ARTHROPLASTY  04/07/2011   Procedure: TOTAL KNEE ARTHROPLASTY;  Surgeon: Gearlean Alf, MD;  Location: [facility];  Service: Orthopedics;  Laterality: Right;    FAMILY HISTORY History reviewed. No pertinent family history.  SOCIAL HISTORY Social History   Tobacco Use  . Smoking status: Never Smoker  . Smokeless tobacco: Never Used  Substance  Use Topics  . Alcohol use: No  . Drug use: No         OPHTHALMIC EXAM:  Base Eye Exam    Visual Acuity (Snellen - Linear)      Right Left   Dist cc 20/20 20/400   Dist ph cc  NI   Correction: Glasses       Tonometry (Tonopen, 8:24 AM)      Right Left   Pressure 6 17       Pupils      Pupils Dark Light Shape React APD   Right PERRL 3 2 Irregular Brisk None   Left PERRL 4 3 Round Brisk None       Visual Fields (Counting fingers)      Left Right     Full   Restrictions Total superior temporal, superior nasal, inferior nasal deficiencies        Neuro/Psych    Oriented x3: Yes   Mood/Affect: Normal       Dilation    Both eyes: 1.0% Mydriacyl, 2.5%  Phenylephrine @ 8:24 AM        Slit Lamp and Fundus Exam    Slit Lamp Exam      Right Left   Lids/Lashes Normal Normal   Conjunctiva/Sclera Inadvertent but pale functioning bleb superiorly White and quiet   Cornea Clear Clear   Anterior Chamber ACIOL central Deep and quiet   Iris Round and reactive Round and reactive   Lens Centered anterior chamber intraocular lens Centered posterior chamber intraocular lens   Anterior Vitreous Clear avitric Normal       Fundus Exam      Right Left   Posterior Vitreous Clear avitric Posterior vitreous detachment   Disc PERI papillary atrophy Peripapillary atrophy stable   C/D Ratio 0.2 0.45   Macula Myopic macular contour and early atrophy Posterior staphyloma with central macular atrophy, stable   Vessels Normal Normal   Periphery Peripheral lattice degeneration good retinopexy mid periphery and laser 360, a attached A attached 360 with no new peripheral holes or tears.  Good chorioretinal scarring          IMAGING AND PROCEDURES  Imaging and Procedures for 05/28/20  OCT, Retina - OU - Both Eyes       Right Eye Quality was good. Scan locations included subfoveal. Central Foveal Thickness: 307. Progression has been stable. Findings include abnormal foveal contour, myopic contour.   Left Eye Quality was good. Central Foveal Thickness: 360. Progression has been stable. Findings include myopic contour, no SRF, no IRF, abnormal foveal contour.   Notes OS with large posterior staphyloma, no change, with central macular atrophy no change  OD .  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 Early stage nonexudative age-related macular degeneration of  right eye  H35.3111 OCT, Retina - OU - Both Eyes  2. Primary open angle glaucoma of both eyes, mild stage  H40.1131   3. Advanced nonexudative age-related macular degeneration of left eye with subfoveal involvement  H35.3124   4. History of vitrectomy  Z98.890   5. Myopic macular degeneration of both eyes  H35.30   6. Posterior staphyloma,  left  H15.832     1.  OD, pseudophakic with excellent acuity preserved.  With a history of dislocated intraocular lens right eye, status post vitrectomy removal of IOL, insertion of ACIOL OD  Right eye also with a history of lattice degeneration no new retinal holes tears or breaks stable.  History of myopic macular degeneration mild stable  2.  OS with a history of posterior staphyloma, myopic macular atrophy splitting the fovea and accounting for acuity.  No peripheral retinal holes or tears today  3.  Stable overall.  Ophthalmic Meds Ordered this visit:  No orders of the defined types were placed in this encounter.      Return in about 1 year (around 05/28/2021) for DILATE OU, COLOR FP, OCT.  Patient Instructions  Patient instructed to contact the office promptly for new onset visual acuity declines distortions or changes    Explained the diagnoses, plan, and follow up with the patient and they expressed understanding.  Patient expressed understanding of the importance of proper follow up care.   Clent Demark Karon Heckendorn M.D. Diseases & Surgery of the Retina and Vitreous [facility] [place] 05/28/20     Abbreviations: M myopia (nearsighted); A astigmatism; H hyperopia (farsighted); P presbyopia; Mrx spectacle prescription;  CTL contact lenses; OD right eye; OS left eye; OU both eyes  XT exotropia; ET esotropia; PEK punctate epithelial keratitis; PEE punctate epithelial erosions; DES dry eye syndrome; MGD meibomian gland dysfunction; ATs artificial tears; PFAT's preservative free artificial tears; [place] nuclear sclerotic cataract; PSC  posterior subcapsular cataract; ERM epi-retinal membrane; PVD posterior vitreous detachment; RD retinal detachment; DM diabetes mellitus; DR diabetic retinopathy; NPDR non-proliferative diabetic retinopathy; PDR proliferative diabetic retinopathy; CSME clinically significant macular edema; DME diabetic macular edema; dbh dot blot hemorrhages; CWS cotton wool spot; POAG primary open angle glaucoma; C/D cup-to-disc ratio; HVF humphrey visual field; GVF goldmann visual field; OCT optical coherence tomography; IOP intraocular pressure; BRVO Branch retinal vein occlusion; CRVO central retinal vein occlusion; CRAO central retinal artery occlusion; BRAO branch retinal artery occlusion; RT retinal tear; SB scleral buckle; PPV pars plana vitrectomy; VH Vitreous hemorrhage; PRP panretinal laser photocoagulation; IVK intravitreal kenalog; VMT vitreomacular traction; MH Macular hole;  NVD neovascularization of the disc; NVE neovascularization elsewhere; AREDS age related eye disease study; ARMD age related macular degeneration; POAG primary open angle glaucoma; EBMD epithelial/anterior basement membrane dystrophy; ACIOL anterior chamber intraocular lens; IOL intraocular lens; PCIOL posterior chamber intraocular lens; Phaco/IOL phacoemulsification with intraocular lens placement; [place] photorefractive keratectomy; LASIK laser assisted in situ keratomileusis; HTN hypertension; DM diabetes mellitus; COPD chronic obstructive pulmonary disease

## 2020-05-28 NOTE — Assessment & Plan Note (Signed)
Stable OD, no active maculopathy 

## 2020-05-28 NOTE — Patient Instructions (Signed)
Patient instructed to contact the office promptly for new onset visual acuity declines distortions or changes

## 2020-06-21 DIAGNOSIS — Z85828 Personal history of other malignant neoplasm of skin: Secondary | ICD-10-CM | POA: Diagnosis not present

## 2020-06-21 DIAGNOSIS — L988 Other specified disorders of the skin and subcutaneous tissue: Secondary | ICD-10-CM | POA: Diagnosis not present

## 2020-06-21 DIAGNOSIS — B351 Tinea unguium: Secondary | ICD-10-CM | POA: Diagnosis not present

## 2020-07-23 DIAGNOSIS — E039 Hypothyroidism, unspecified: Secondary | ICD-10-CM | POA: Diagnosis not present

## 2020-09-04 DIAGNOSIS — M810 Age-related osteoporosis without current pathological fracture: Secondary | ICD-10-CM | POA: Diagnosis not present

## 2020-09-27 DIAGNOSIS — R151 Fecal smearing: Secondary | ICD-10-CM | POA: Diagnosis not present

## 2020-09-27 DIAGNOSIS — Z86010 Personal history of colonic polyps: Secondary | ICD-10-CM | POA: Diagnosis not present

## 2020-09-27 DIAGNOSIS — R198 Other specified symptoms and signs involving the digestive system and abdomen: Secondary | ICD-10-CM | POA: Diagnosis not present

## 2020-10-10 DIAGNOSIS — Z86010 Personal history of colonic polyps: Secondary | ICD-10-CM | POA: Diagnosis not present

## 2020-10-10 DIAGNOSIS — D122 Benign neoplasm of ascending colon: Secondary | ICD-10-CM | POA: Diagnosis not present

## 2020-10-10 DIAGNOSIS — D120 Benign neoplasm of cecum: Secondary | ICD-10-CM | POA: Diagnosis not present

## 2020-10-15 DIAGNOSIS — D120 Benign neoplasm of cecum: Secondary | ICD-10-CM | POA: Diagnosis not present

## 2020-10-15 DIAGNOSIS — D122 Benign neoplasm of ascending colon: Secondary | ICD-10-CM | POA: Diagnosis not present

## 2020-11-13 DIAGNOSIS — Z1231 Encounter for screening mammogram for malignant neoplasm of breast: Secondary | ICD-10-CM | POA: Diagnosis not present

## 2020-11-28 DIAGNOSIS — Z961 Presence of intraocular lens: Secondary | ICD-10-CM | POA: Diagnosis not present

## 2020-11-28 DIAGNOSIS — H31012 Macula scars of posterior pole (postinflammatory) (post-traumatic), left eye: Secondary | ICD-10-CM | POA: Diagnosis not present

## 2020-11-30 DIAGNOSIS — Z86010 Personal history of colonic polyps: Secondary | ICD-10-CM | POA: Diagnosis not present

## 2020-11-30 DIAGNOSIS — R198 Other specified symptoms and signs involving the digestive system and abdomen: Secondary | ICD-10-CM | POA: Diagnosis not present

## 2021-02-01 DIAGNOSIS — D2262 Melanocytic nevi of left upper limb, including shoulder: Secondary | ICD-10-CM | POA: Diagnosis not present

## 2021-02-01 DIAGNOSIS — Z85828 Personal history of other malignant neoplasm of skin: Secondary | ICD-10-CM | POA: Diagnosis not present

## 2021-02-01 DIAGNOSIS — D2272 Melanocytic nevi of left lower limb, including hip: Secondary | ICD-10-CM | POA: Diagnosis not present

## 2021-02-01 DIAGNOSIS — L821 Other seborrheic keratosis: Secondary | ICD-10-CM | POA: Diagnosis not present

## 2021-02-01 DIAGNOSIS — D1801 Hemangioma of skin and subcutaneous tissue: Secondary | ICD-10-CM | POA: Diagnosis not present

## 2021-02-01 DIAGNOSIS — D225 Melanocytic nevi of trunk: Secondary | ICD-10-CM | POA: Diagnosis not present

## 2021-02-01 DIAGNOSIS — L814 Other melanin hyperpigmentation: Secondary | ICD-10-CM | POA: Diagnosis not present

## 2021-03-12 DIAGNOSIS — M810 Age-related osteoporosis without current pathological fracture: Secondary | ICD-10-CM | POA: Diagnosis not present

## 2021-05-07 DIAGNOSIS — E039 Hypothyroidism, unspecified: Secondary | ICD-10-CM | POA: Diagnosis not present

## 2021-05-07 DIAGNOSIS — E785 Hyperlipidemia, unspecified: Secondary | ICD-10-CM | POA: Diagnosis not present

## 2021-05-07 DIAGNOSIS — M810 Age-related osteoporosis without current pathological fracture: Secondary | ICD-10-CM | POA: Diagnosis not present

## 2021-05-07 DIAGNOSIS — Z0000 Encounter for general adult medical examination without abnormal findings: Secondary | ICD-10-CM | POA: Diagnosis not present

## 2021-05-07 DIAGNOSIS — Z1389 Encounter for screening for other disorder: Secondary | ICD-10-CM | POA: Diagnosis not present

## 2021-05-28 ENCOUNTER — Ambulatory Visit (INDEPENDENT_AMBULATORY_CARE_PROVIDER_SITE_OTHER): Payer: Medicare PPO | Admitting: Ophthalmology

## 2021-05-28 ENCOUNTER — Other Ambulatory Visit: Payer: Self-pay

## 2021-05-28 DIAGNOSIS — H353124 Nonexudative age-related macular degeneration, left eye, advanced atrophic with subfoveal involvement: Secondary | ICD-10-CM

## 2021-05-28 DIAGNOSIS — H353111 Nonexudative age-related macular degeneration, right eye, early dry stage: Secondary | ICD-10-CM | POA: Diagnosis not present

## 2021-05-28 DIAGNOSIS — H401131 Primary open-angle glaucoma, bilateral, mild stage: Secondary | ICD-10-CM

## 2021-05-28 DIAGNOSIS — H3530 Unspecified macular degeneration: Secondary | ICD-10-CM

## 2021-05-28 NOTE — Assessment & Plan Note (Signed)
Geographic atrophy, no active disease peripherally ?

## 2021-05-28 NOTE — Assessment & Plan Note (Signed)
Underlies OU the peripheral and macular changes the retina ?

## 2021-05-28 NOTE — Assessment & Plan Note (Signed)
Follow-up with Dr. Susa Simmonds as scheduled ?

## 2021-05-28 NOTE — Progress Notes (Signed)
05/28/2021     CHIEF COMPLAINT Patient presents for  Chief Complaint  Patient presents with   Macular Degeneration      HISTORY OF PRESENT ILLNESS: Marilyn Crosby is a 81 y.o. female who presents to the clinic today for:   HPI   1 yr fu ou oct fp. Pt states no changes in medical history and vision has been stable in the past year. Pt is still taking Latanopost nightly Pt denies any floaters and FOL.   Last edited by Angeline Slim on 05/28/2021  8:03 AM.      Referring physician: Merri Brunette, MD [phone] [address] [place],  [place] [address]  HISTORICAL INFORMATION:   Selected notes from the MEDICAL RECORD NUMBER       CURRENT MEDICATIONS: Current Outpatient Medications (Ophthalmic Drugs)  Medication Sig   latanoprost (XALATAN) 0.005 % ophthalmic solution latanoprost 0.005 % eye drops  INSTILL 1 DROP IN LEFT EYE EVERY NIGHT   No current facility-administered medications for this visit. (Ophthalmic Drugs)   Current Outpatient Medications (Other)  Medication Sig   denosumab (PROLIA) 60 MG/ML SOLN injection Inject 60 mg into the skin every 6 (six) months. Administer in upper arm, thigh, or abdomen   levothyroxine (SYNTHROID, LEVOTHROID) 125 MCG tablet Take 0.1 mcg by mouth at bedtime. Dosage is per pt. On 03-31-11   TAZORAC 0.1 % cream    tretinoin (RETIN-A) 0.01 % gel Apply 1 application topically every other day.     No current facility-administered medications for this visit. (Other)      REVIEW OF SYSTEMS: ROS   Negative for: Constitutional, Gastrointestinal, Neurological, Skin, Genitourinary, Musculoskeletal, HENT, Endocrine, Cardiovascular, Eyes, Respiratory, Psychiatric, Allergic/Imm, Heme/Lymph Last edited by Angeline Slim on 05/28/2021  8:03 AM.       ALLERGIES Allergies  Allergen Reactions   Morphine And Related Itching    PAST MEDICAL HISTORY Past Medical History:  Diagnosis Date   Ankle fracture, left 03-31-11   '09-soft cast only    Arthritis 03-31-11   osteoarthritis-knees and hands   Cancer (HCC) 03-31-11   left breast 2011-radiation only   Cystocele    HX OF   Hx of colonic polyps    Pneumonia    Rectocele    Past Surgical History:  Procedure Laterality Date   ABDOMINAL HYSTERECTOMY  03-31-11   2010-Prolapse uterus   BREAST SURGERY  03-31-11   Lumpectomy-dx. 2011 left breast cancer-radiation   BUNIONECTOMY  03-31-11   bil.'96/'99   CATARACT EXTRACTION W/ INTRAOCULAR LENS IMPLANT  03-31-11   bil.'95 and rt.eye again '05   COLONOSCOPY W/ POLYPECTOMY  09/21/2000   GANGLION CYST EXCISION  03-31-11   left wrist   HAMMER TOE SURGERY  03-31-11   bil.    JOINT REPLACEMENT  03-31-11   LTKA('09), now rt. planned   NASAL SEPTUM SURGERY  03-31-11   deviated septum '75   TONSILLECTOMY  1947   child   TOTAL KNEE ARTHROPLASTY  04/07/2011   Procedure: TOTAL KNEE ARTHROPLASTY;  Surgeon: Loanne Drilling, MD;  Location: [facility];  Service: Orthopedics;  Laterality: Right;    FAMILY HISTORY No family history on file.  SOCIAL HISTORY Social History   Tobacco Use   Smoking status: Never   Smokeless tobacco: Never  Substance Use Topics   Alcohol use: No   Drug use: No         OPHTHALMIC EXAM:  Base Eye Exam     Visual Acuity (ETDRS)  Right Left   Dist cc 20/20 -2 20/400   Dist ph cc 20/400 20/200 -1    Correction: Glasses         Tonometry (Tonopen, 8:08 AM)       Right Left   Pressure 4 19         Pupils       Dark Light Shape React APD   Right 3 3 Irregular Minimal None   Left 3 2 Irregular Minimal None         Visual Fields       Left Right    Full Full         Extraocular Movement       Right Left    Full, Ortho Full, Ortho         Neuro/Psych     Oriented x3: Yes   Mood/Affect: Normal         Dilation     Both eyes: 1.0% Mydriacyl, 2.5% Phenylephrine @ 8:08 AM           Slit Lamp and Fundus Exam     Slit Lamp Exam       Right Left   Lids/Lashes  Normal Normal   Conjunctiva/Sclera Inadvertent but pale functioning bleb superiorly White and quiet   Cornea Clear Clear   Anterior Chamber ACIOL central Deep and quiet   Iris Round and reactive Round and reactive   Lens Centered anterior chamber intraocular lens Centered posterior chamber intraocular lens   Anterior Vitreous Clear avitric Normal         Fundus Exam       Right Left   Posterior Vitreous Clear avitric Posterior vitreous detachment   Disc PERI papillary atrophy Peripapillary atrophy stable   C/D Ratio 0.2 0.45   Macula Myopic macular contour and early atrophy Posterior staphyloma with central macular atrophy, stable   Vessels Normal Normal   Periphery Peripheral lattice degeneration good retinopexy mid periphery and laser 360, a attached A attached 360 with no new peripheral holes or tears.  Good chorioretinal scarring            IMAGING AND PROCEDURES  Imaging and Procedures for 05/28/21  OCT, Retina - OU - Both Eyes       Right Eye Quality was good. Scan locations included subfoveal. Central Foveal Thickness: 307. Progression has been stable. Findings include abnormal foveal contour, myopic contour.   Left Eye Quality was borderline. Central Foveal Thickness: 360. Progression has been stable. Findings include myopic contour, no SRF, no IRF, abnormal foveal contour.   Notes OS with large posterior staphyloma, no change, with central macular atrophy no change  OD .  Myopic macular contour, no signs of active maculopathy or degeneration     Color Fundus Photography Optos - OU - Both Eyes       Right Eye Progression has no prior data. Disc findings include increased cup to disc ratio. Macula : geographic atrophy. Vessels : normal observations. Periphery : normal observations.   Left Eye Progression has no prior data. Disc findings include increased cup to disc ratio. Macula : geographic atrophy. Vessels : normal observations. Periphery : normal  observations.   Notes Geographic atrophy in the macula OS accounts for acuity stable over time, posterior staphyloma present.  No active maculopathy seen retina is attached  OD peripapillary atrophy Exist, no extension into the macular region, peripheral chorioretinal scars are seen peripherally  ASSESSMENT/PLAN:  Advanced nonexudative age-related macular degeneration of left eye with subfoveal involvement Geographic atrophy, no active disease peripherally  Myopic macular degeneration of both eyes Underlies OU the peripheral and macular changes the retina  Primary open angle glaucoma of both eyes, mild stage Follow-up with Dr. Valere Dross as scheduled     ICD-10-CM   1. Early stage nonexudative age-related macular degeneration of right eye  H35.3111 OCT, Retina - OU - Both Eyes    Color Fundus Photography Optos - OU - Both Eyes    2. Advanced nonexudative age-related macular degeneration of left eye with subfoveal involvement  H35.3124     3. Myopic macular degeneration of both eyes  H35.30     4. Primary open angle glaucoma of both eyes, mild stage  H40.1131       1.  OU with dry AMD mostly from myopic macular degeneration, stable now for many years, no complications  2.  Mild OAG, with optic nerve changes some of which are related to the peripapillary atrophy OS, yet under the care of Dr. Valere Dross  3.  Ophthalmic Meds Ordered this visit:  No orders of the defined types were placed in this encounter.      Return in about 1 year (around 05/29/2022) for DILATE OU, COLOR FP, OCT.  There are no Patient Instructions on file for this visit.   Explained the diagnoses, plan, and follow up with the patient and they expressed understanding.  Patient expressed understanding of the importance of proper follow up care.   Alford Highland Jeneen Doutt M.D. Diseases & Surgery of the Retina and Vitreous [facility] 05/28/21     Abbreviations: M myopia  (nearsighted); A astigmatism; H hyperopia (farsighted); P presbyopia; Mrx spectacle prescription;  CTL contact lenses; OD right eye; OS left eye; OU both eyes  XT exotropia; ET esotropia; PEK punctate epithelial keratitis; PEE punctate epithelial erosions; DES dry eye syndrome; MGD meibomian gland dysfunction; ATs artificial tears; PFAT's preservative free artificial tears; NSC nuclear sclerotic cataract; PSC posterior subcapsular cataract; ERM epi-retinal membrane; PVD posterior vitreous detachment; RD retinal detachment; DM diabetes mellitus; DR diabetic retinopathy; NPDR non-proliferative diabetic retinopathy; PDR proliferative diabetic retinopathy; CSME clinically significant macular edema; DME diabetic macular edema; dbh dot blot hemorrhages; CWS cotton wool spot; POAG primary open angle glaucoma; C/D cup-to-disc ratio; HVF humphrey visual field; GVF goldmann visual field; OCT optical coherence tomography; IOP intraocular pressure; BRVO Branch retinal vein occlusion; CRVO central retinal vein occlusion; CRAO central retinal artery occlusion; BRAO branch retinal artery occlusion; RT retinal tear; SB scleral buckle; PPV pars plana vitrectomy; VH Vitreous hemorrhage; PRP panretinal laser photocoagulation; IVK intravitreal kenalog; VMT vitreomacular traction; MH Macular hole;  NVD neovascularization of the disc; NVE neovascularization elsewhere; AREDS age related eye disease study; ARMD age related macular degeneration; POAG primary open angle glaucoma; EBMD epithelial/anterior basement membrane dystrophy; ACIOL anterior chamber intraocular lens; IOL intraocular lens; PCIOL posterior chamber intraocular lens; Phaco/IOL phacoemulsification with intraocular lens placement; PRK photorefractive keratectomy; LASIK laser assisted in situ keratomileusis; HTN hypertension; DM diabetes mellitus; COPD chronic obstructive pulmonary disease

## 2021-09-26 DIAGNOSIS — E785 Hyperlipidemia, unspecified: Secondary | ICD-10-CM | POA: Diagnosis not present

## 2021-09-26 DIAGNOSIS — H6121 Impacted cerumen, right ear: Secondary | ICD-10-CM | POA: Diagnosis not present

## 2021-09-26 DIAGNOSIS — M810 Age-related osteoporosis without current pathological fracture: Secondary | ICD-10-CM | POA: Diagnosis not present

## 2021-09-26 DIAGNOSIS — E039 Hypothyroidism, unspecified: Secondary | ICD-10-CM | POA: Diagnosis not present

## 2021-11-20 DIAGNOSIS — M8589 Other specified disorders of bone density and structure, multiple sites: Secondary | ICD-10-CM | POA: Diagnosis not present

## 2021-11-20 DIAGNOSIS — Z1231 Encounter for screening mammogram for malignant neoplasm of breast: Secondary | ICD-10-CM | POA: Diagnosis not present

## 2021-11-25 DIAGNOSIS — E039 Hypothyroidism, unspecified: Secondary | ICD-10-CM | POA: Diagnosis not present

## 2021-12-25 DIAGNOSIS — H6121 Impacted cerumen, right ear: Secondary | ICD-10-CM | POA: Diagnosis not present

## 2022-01-15 DIAGNOSIS — Z961 Presence of intraocular lens: Secondary | ICD-10-CM | POA: Diagnosis not present

## 2022-01-15 DIAGNOSIS — H31012 Macula scars of posterior pole (postinflammatory) (post-traumatic), left eye: Secondary | ICD-10-CM | POA: Diagnosis not present

## 2022-02-04 DIAGNOSIS — D1801 Hemangioma of skin and subcutaneous tissue: Secondary | ICD-10-CM | POA: Diagnosis not present

## 2022-02-04 DIAGNOSIS — C44519 Basal cell carcinoma of skin of other part of trunk: Secondary | ICD-10-CM | POA: Diagnosis not present

## 2022-02-04 DIAGNOSIS — L821 Other seborrheic keratosis: Secondary | ICD-10-CM | POA: Diagnosis not present

## 2022-02-04 DIAGNOSIS — Z85828 Personal history of other malignant neoplasm of skin: Secondary | ICD-10-CM | POA: Diagnosis not present

## 2022-04-02 DIAGNOSIS — M810 Age-related osteoporosis without current pathological fracture: Secondary | ICD-10-CM | POA: Diagnosis not present

## 2022-05-29 ENCOUNTER — Encounter (INDEPENDENT_AMBULATORY_CARE_PROVIDER_SITE_OTHER): Payer: Medicare PPO | Admitting: Ophthalmology

## 2022-06-02 DIAGNOSIS — Z853 Personal history of malignant neoplasm of breast: Secondary | ICD-10-CM | POA: Diagnosis not present

## 2022-06-02 DIAGNOSIS — H401131 Primary open-angle glaucoma, bilateral, mild stage: Secondary | ICD-10-CM | POA: Diagnosis not present

## 2022-06-02 DIAGNOSIS — Z0000 Encounter for general adult medical examination without abnormal findings: Secondary | ICD-10-CM | POA: Diagnosis not present

## 2022-06-02 DIAGNOSIS — Z1331 Encounter for screening for depression: Secondary | ICD-10-CM | POA: Diagnosis not present

## 2022-06-02 DIAGNOSIS — E039 Hypothyroidism, unspecified: Secondary | ICD-10-CM | POA: Diagnosis not present

## 2022-06-02 DIAGNOSIS — E785 Hyperlipidemia, unspecified: Secondary | ICD-10-CM | POA: Diagnosis not present

## 2022-06-02 DIAGNOSIS — M810 Age-related osteoporosis without current pathological fracture: Secondary | ICD-10-CM | POA: Diagnosis not present

## 2022-06-04 DIAGNOSIS — D485 Neoplasm of uncertain behavior of skin: Secondary | ICD-10-CM | POA: Diagnosis not present

## 2022-06-04 DIAGNOSIS — C44519 Basal cell carcinoma of skin of other part of trunk: Secondary | ICD-10-CM | POA: Diagnosis not present

## 2022-06-04 DIAGNOSIS — B078 Other viral warts: Secondary | ICD-10-CM | POA: Diagnosis not present

## 2022-06-04 DIAGNOSIS — Z85828 Personal history of other malignant neoplasm of skin: Secondary | ICD-10-CM | POA: Diagnosis not present

## 2022-07-22 DIAGNOSIS — H353111 Nonexudative age-related macular degeneration, right eye, early dry stage: Secondary | ICD-10-CM | POA: Diagnosis not present

## 2022-07-22 DIAGNOSIS — H353124 Nonexudative age-related macular degeneration, left eye, advanced atrophic with subfoveal involvement: Secondary | ICD-10-CM | POA: Diagnosis not present

## 2022-07-22 DIAGNOSIS — H4423 Degenerative myopia, bilateral: Secondary | ICD-10-CM | POA: Diagnosis not present

## 2022-07-22 DIAGNOSIS — H401121 Primary open-angle glaucoma, left eye, mild stage: Secondary | ICD-10-CM | POA: Diagnosis not present

## 2022-07-22 DIAGNOSIS — Z9889 Other specified postprocedural states: Secondary | ICD-10-CM | POA: Diagnosis not present

## 2022-09-03 DIAGNOSIS — H353111 Nonexudative age-related macular degeneration, right eye, early dry stage: Secondary | ICD-10-CM | POA: Diagnosis not present

## 2022-09-03 DIAGNOSIS — Z9889 Other specified postprocedural states: Secondary | ICD-10-CM | POA: Diagnosis not present

## 2022-09-03 DIAGNOSIS — H4423 Degenerative myopia, bilateral: Secondary | ICD-10-CM | POA: Diagnosis not present

## 2022-09-03 DIAGNOSIS — H401121 Primary open-angle glaucoma, left eye, mild stage: Secondary | ICD-10-CM | POA: Diagnosis not present

## 2022-09-03 DIAGNOSIS — H353124 Nonexudative age-related macular degeneration, left eye, advanced atrophic with subfoveal involvement: Secondary | ICD-10-CM | POA: Diagnosis not present

## 2022-09-29 DIAGNOSIS — E039 Hypothyroidism, unspecified: Secondary | ICD-10-CM | POA: Diagnosis not present

## 2022-09-29 DIAGNOSIS — M810 Age-related osteoporosis without current pathological fracture: Secondary | ICD-10-CM | POA: Diagnosis not present

## 2022-11-26 DIAGNOSIS — Z1231 Encounter for screening mammogram for malignant neoplasm of breast: Secondary | ICD-10-CM | POA: Diagnosis not present

## 2022-12-03 DIAGNOSIS — R0789 Other chest pain: Secondary | ICD-10-CM | POA: Diagnosis not present

## 2022-12-03 DIAGNOSIS — W19XXXA Unspecified fall, initial encounter: Secondary | ICD-10-CM | POA: Diagnosis not present

## 2022-12-03 DIAGNOSIS — R030 Elevated blood-pressure reading, without diagnosis of hypertension: Secondary | ICD-10-CM | POA: Diagnosis not present

## 2022-12-15 DIAGNOSIS — B349 Viral infection, unspecified: Secondary | ICD-10-CM | POA: Diagnosis not present

## 2022-12-16 DIAGNOSIS — R079 Chest pain, unspecified: Secondary | ICD-10-CM | POA: Diagnosis not present

## 2022-12-22 DIAGNOSIS — Z9181 History of falling: Secondary | ICD-10-CM | POA: Diagnosis not present

## 2022-12-22 DIAGNOSIS — R059 Cough, unspecified: Secondary | ICD-10-CM | POA: Diagnosis not present

## 2023-01-05 DIAGNOSIS — Z85828 Personal history of other malignant neoplasm of skin: Secondary | ICD-10-CM | POA: Diagnosis not present

## 2023-01-05 DIAGNOSIS — L820 Inflamed seborrheic keratosis: Secondary | ICD-10-CM | POA: Diagnosis not present

## 2023-01-05 DIAGNOSIS — L650 Telogen effluvium: Secondary | ICD-10-CM | POA: Diagnosis not present

## 2023-01-21 DIAGNOSIS — Z961 Presence of intraocular lens: Secondary | ICD-10-CM | POA: Diagnosis not present

## 2023-01-21 DIAGNOSIS — H31012 Macula scars of posterior pole (postinflammatory) (post-traumatic), left eye: Secondary | ICD-10-CM | POA: Diagnosis not present

## 2023-04-09 DIAGNOSIS — M8580 Other specified disorders of bone density and structure, unspecified site: Secondary | ICD-10-CM | POA: Diagnosis not present

## 2023-06-04 DIAGNOSIS — E039 Hypothyroidism, unspecified: Secondary | ICD-10-CM | POA: Diagnosis not present

## 2023-06-04 DIAGNOSIS — E785 Hyperlipidemia, unspecified: Secondary | ICD-10-CM | POA: Diagnosis not present

## 2023-06-08 DIAGNOSIS — E039 Hypothyroidism, unspecified: Secondary | ICD-10-CM | POA: Diagnosis not present

## 2023-06-08 DIAGNOSIS — M810 Age-related osteoporosis without current pathological fracture: Secondary | ICD-10-CM | POA: Diagnosis not present

## 2023-06-08 DIAGNOSIS — E785 Hyperlipidemia, unspecified: Secondary | ICD-10-CM | POA: Diagnosis not present

## 2023-06-08 DIAGNOSIS — Z1331 Encounter for screening for depression: Secondary | ICD-10-CM | POA: Diagnosis not present

## 2023-06-08 DIAGNOSIS — Z0000 Encounter for general adult medical examination without abnormal findings: Secondary | ICD-10-CM | POA: Diagnosis not present

## 2023-07-22 DIAGNOSIS — H353124 Nonexudative age-related macular degeneration, left eye, advanced atrophic with subfoveal involvement: Secondary | ICD-10-CM | POA: Diagnosis not present

## 2023-07-22 DIAGNOSIS — H401122 Primary open-angle glaucoma, left eye, moderate stage: Secondary | ICD-10-CM | POA: Diagnosis not present

## 2023-07-22 DIAGNOSIS — Z9889 Other specified postprocedural states: Secondary | ICD-10-CM | POA: Diagnosis not present

## 2023-07-22 DIAGNOSIS — H401121 Primary open-angle glaucoma, left eye, mild stage: Secondary | ICD-10-CM | POA: Diagnosis not present

## 2023-07-22 DIAGNOSIS — H353111 Nonexudative age-related macular degeneration, right eye, early dry stage: Secondary | ICD-10-CM | POA: Diagnosis not present

## 2023-07-23 DIAGNOSIS — Z96653 Presence of artificial knee joint, bilateral: Secondary | ICD-10-CM | POA: Diagnosis not present

## 2023-07-23 DIAGNOSIS — M25562 Pain in left knee: Secondary | ICD-10-CM | POA: Diagnosis not present

## 2023-07-23 DIAGNOSIS — M25561 Pain in right knee: Secondary | ICD-10-CM | POA: Diagnosis not present

## 2023-08-19 DIAGNOSIS — J069 Acute upper respiratory infection, unspecified: Secondary | ICD-10-CM | POA: Diagnosis not present

## 2023-08-19 DIAGNOSIS — R030 Elevated blood-pressure reading, without diagnosis of hypertension: Secondary | ICD-10-CM | POA: Diagnosis not present

## 2023-10-27 DIAGNOSIS — R052 Subacute cough: Secondary | ICD-10-CM | POA: Diagnosis not present

## 2023-10-27 DIAGNOSIS — R030 Elevated blood-pressure reading, without diagnosis of hypertension: Secondary | ICD-10-CM | POA: Diagnosis not present

## 2023-10-27 DIAGNOSIS — J069 Acute upper respiratory infection, unspecified: Secondary | ICD-10-CM | POA: Diagnosis not present

## 2023-11-09 DIAGNOSIS — R052 Subacute cough: Secondary | ICD-10-CM | POA: Diagnosis not present

## 2023-11-09 DIAGNOSIS — Z853 Personal history of malignant neoplasm of breast: Secondary | ICD-10-CM | POA: Diagnosis not present

## 2023-11-09 DIAGNOSIS — Z820 Family history of epilepsy and other diseases of the nervous system: Secondary | ICD-10-CM | POA: Diagnosis not present

## 2023-12-02 DIAGNOSIS — Z1231 Encounter for screening mammogram for malignant neoplasm of breast: Secondary | ICD-10-CM | POA: Diagnosis not present

## 2023-12-02 DIAGNOSIS — M8589 Other specified disorders of bone density and structure, multiple sites: Secondary | ICD-10-CM | POA: Diagnosis not present

## 2023-12-09 DIAGNOSIS — K5900 Constipation, unspecified: Secondary | ICD-10-CM | POA: Diagnosis not present

## 2023-12-09 DIAGNOSIS — Z860101 Personal history of adenomatous and serrated colon polyps: Secondary | ICD-10-CM | POA: Diagnosis not present

## 2023-12-10 DIAGNOSIS — H4423 Degenerative myopia, bilateral: Secondary | ICD-10-CM | POA: Diagnosis not present

## 2023-12-10 DIAGNOSIS — H401122 Primary open-angle glaucoma, left eye, moderate stage: Secondary | ICD-10-CM | POA: Diagnosis not present

## 2023-12-10 DIAGNOSIS — H353124 Nonexudative age-related macular degeneration, left eye, advanced atrophic with subfoveal involvement: Secondary | ICD-10-CM | POA: Diagnosis not present

## 2023-12-10 DIAGNOSIS — H353112 Nonexudative age-related macular degeneration, right eye, intermediate dry stage: Secondary | ICD-10-CM | POA: Diagnosis not present

## 2023-12-10 DIAGNOSIS — Z9889 Other specified postprocedural states: Secondary | ICD-10-CM | POA: Diagnosis not present

## 2023-12-10 DIAGNOSIS — H18421 Band keratopathy, right eye: Secondary | ICD-10-CM | POA: Diagnosis not present

## 2023-12-10 DIAGNOSIS — H401121 Primary open-angle glaucoma, left eye, mild stage: Secondary | ICD-10-CM | POA: Diagnosis not present

## 2023-12-10 DIAGNOSIS — Z961 Presence of intraocular lens: Secondary | ICD-10-CM | POA: Diagnosis not present

## 2023-12-29 ENCOUNTER — Ambulatory Visit: Admitting: Internal Medicine

## 2023-12-29 ENCOUNTER — Encounter: Payer: Self-pay | Admitting: Internal Medicine

## 2023-12-29 VITALS — BP 124/80 | HR 72 | Temp 97.4°F | Ht 63.0 in | Wt 147.0 lb

## 2023-12-29 DIAGNOSIS — R053 Chronic cough: Secondary | ICD-10-CM | POA: Diagnosis not present

## 2023-12-29 DIAGNOSIS — Z923 Personal history of irradiation: Secondary | ICD-10-CM | POA: Diagnosis not present

## 2023-12-29 DIAGNOSIS — J309 Allergic rhinitis, unspecified: Secondary | ICD-10-CM

## 2023-12-29 LAB — POCT EXHALED NITRIC OXIDE: FeNO level (ppb): 61

## 2023-12-29 MED ORDER — AZELASTINE HCL 0.1 % NA SOLN: 1.0000 | Freq: Two times a day (BID) | NASAL | 12 refills | Status: AC

## 2023-12-29 MED ORDER — MONTELUKAST SODIUM 10 MG PO TABS: 10.0000 mg | ORAL_TABLET | Freq: Every day | ORAL | 11 refills | Status: DC

## 2023-12-29 NOTE — Progress Notes (Signed)
 Marilyn Crosby    [id number]    10-06-1940  Primary Care Physician:Smith, [place], MD  Referring Physician: Teresa Channel, MD [phone] [address] [place],  [place] [address] Reason for Consultation: chronic cough Date of Consultation: 12/29/2023  Chief complaint:   Chief Complaint  Patient presents with   Cough   Consult    Constant dry coughing. Started years ago     HPI:  Discussed the use of AI scribe software for clinical note transcription with the patient, who gave verbal consent to proceed.  History of Present Illness Marilyn Crosby is an 83 year old female with a history of breast cancer treated with lumpectomy and radiation who presents with a chronic cough.  She has experienced a chronic dry cough for approximately 14 years, which began following radiation treatment for breast cancer. The cough has been persistent and aggravating, worsening significantly this past summer to become continuous and severe enough to induce vomiting. It disrupts her during conversations and meetings, requiring her to leave the room at times.  She has attempted to manage the symptoms with gum and cough drops, which provide some relief. She was prescribed antacids for suspected acid reflux by another physician, but after trying two different antacids, including Pepcid, without relief, she discontinued them. She takes loratadine for allergies, having switched from a generic Benadryl , but has not noticed any change in her cough.  There is no seasonal variation in her cough, and she reports no associated symptoms such as shortness of breath, chest tightness, or wheezing. The cough occasionally wakes her at night but is not related to eating. She does not experience heartburn or reflux symptoms.  Her past medical history includes a lumpectomy and radiation on the left side for breast cancer. She has no history of asthma, allergies, or smoking, and there is no family history of  chronic lung issues. She lives with her husband, who does not smoke, and she has no pets. She is a retired Psychologist, forensic and remains physically active, participating in aerobics classes, though the cough can be bothersome during exercise.   Social history:  Occupation: retired, Runner, broadcasting/film/video, high school Exposures: lives with husband, no pets.  Smoking history: never smoker, no passive smoke exposure  Social History   Occupational History   Not on file  Tobacco Use   Smoking status: Never    Passive exposure: Never   Smokeless tobacco: Never  Substance and Sexual Activity   Alcohol use: No   Drug use: No   Sexual activity: Never    Relevant family history:  Family History  Problem Relation Age of Onset   Lung disease Neg Hx     Past Medical History:  Diagnosis Date   Ankle fracture, left 03-31-11   '09-soft cast only   Arthritis 03-31-11   osteoarthritis-knees and hands   Cancer (HCC) 03-31-11   left breast 2011-radiation only   Cystocele    HX OF   Hx of colonic polyps    Pneumonia    Rectocele     Past Surgical History:  Procedure Laterality Date   ABDOMINAL HYSTERECTOMY  03-31-11   2010-Prolapse uterus   BREAST SURGERY  03-31-11   Lumpectomy-dx. 2011 left breast cancer-radiation   BUNIONECTOMY  03-31-11   bil.'96/'99   CATARACT EXTRACTION W/ INTRAOCULAR LENS IMPLANT  03-31-11   bil.'95 and rt.eye again '05   COLONOSCOPY W/ POLYPECTOMY  09/21/2000   GANGLION CYST EXCISION  03-31-11   left wrist   HAMMER TOE SURGERY  03-31-11   bil.    JOINT REPLACEMENT  03-31-11   LTKA('09), now rt. planned   NASAL SEPTUM SURGERY  03-31-11   deviated septum '75   TONSILLECTOMY  1947   child   TOTAL KNEE ARTHROPLASTY  04/07/2011   Procedure: TOTAL KNEE ARTHROPLASTY;  Surgeon: Dempsey LULLA Moan, MD;  Location: [facility];  Service: Orthopedics;  Laterality: Right;     Physical Exam: Blood pressure (!) 132/90, pulse 72, temperature (!) 97.4 F (36.3 C), temperature source Oral,  height 5' 3 (1.6 m), weight 147 lb (66.7 kg), SpO2 98%. Gen:      No acute distress ENT:  mild cobblestoning, no thrush, no nasal polyps, mucus membranes moist Lungs:    No increased respiratory effort, symmetric chest wall excursion, clear to auscultation bilaterally, no wheezes or crackles, mild kyphosis CV:         Regular rate and rhythm; no murmurs, rubs, or gallops.  No pedal edema Abd:      + bowel sounds; soft, non-tender; no distension MSK: no acute synovitis of DIP or PIP joints, no mechanics hands.  Skin:      Warm and dry; no rashes Neuro: normal speech, no focal facial asymmetry Psych: alert and oriented x3, normal mood and affect   Data Reviewed/Medical Decision Making:  Independent interpretation of tests: Imaging:  Review of patient's chest xray 2017 images revealed mild biapical scarring. The patient's images have been independently reviewed by me.    PFTs:  Labs:  Lab Results  Component Value Date   NA 133 (L) 04/09/2011   K 3.4 (L) 04/09/2011   CO2 27 04/09/2011   GLUCOSE 113 (H) 04/09/2011   BUN 7 04/09/2011   CREATININE 0.60 04/09/2011   CALCIUM 8.1 (L) 04/09/2011   GFRNONAA >90 04/09/2011   Lab Results  Component Value Date   WBC 6.7 04/09/2011   HGB 8.7 (L) 04/09/2011   HCT 25.9 (L) 04/09/2011   MCV 97.4 04/09/2011   PLT 160 04/09/2011     Immunization status:  Immunization History  Administered Date(s) Administered   Moderna Sars-Covid-2 Vaccination 12/13/2019   Pfizer(Comirnaty)Fall Seasonal Vaccine 12 years and older 12/26/2021     I reviewed prior external note(s) from primary care  I reviewed the result(s) of the labs and imaging as noted above.   I have ordered pft   Assessment and Plan Assessment & Plan Chronic cough Chronic cough for 15 years, worsened recently. Likely postnasal drainage given throat clearing and drainage. Radiation changes on chest x-ray unlikely primary cause. Reflux less likely due to lack of response to  antacids. -Feno today 61 ppb c/w allergic inflammation from TH2 inflammation.  - start montelukast - start azelastine  Chronic allergic rhinitis Postnasal drainage likely contributing to chronic cough. Previous antihistamines and nasal sprays had limited success. Patient willing to retry nasal sprays with proper technique. - Educated on nasal spray technique: blow nose, tilt chin down, use opposite hand for nostril, aim towards sinuses, avoid deep sniffing.   Return to Care: Return in about 6 months (around 06/28/2024) for APP.  Verdon Gore, MD Pulmonary and Critical Care Medicine [facility] Office:[phone]  CC: Teresa Channel, MD

## 2023-12-29 NOTE — Patient Instructions (Addendum)
 VISIT SUMMARY:  Today, we discussed your chronic cough, which has been persistent for 14 years and worsened recently. We reviewed your history, including past treatments and symptoms, and developed a plan to address your cough and postnasal drainage.  YOUR PLAN:  -CHRONIC COUGH: A chronic cough is a cough that lasts for a long time, in your case, 14 years. It may be due to postnasal drainage, which is when mucus from your nose drips down the back of your throat. You can continue claritin  -CHRONIC POSTNASAL DRAINAGE: Postnasal drainage occurs when excess mucus from your nose drips down the back of your throat, which can contribute to your chronic cough. We discussed the proper technique for using nasal sprays, which includes blowing your nose, tilting your chin down, using the opposite hand for the nostril, aiming towards the sinuses, and avoiding deep sniffing. Your testing today shows significant allergic inflammation driving this cough and drainage.   Astelin - 1 spray on each side of your nose twice a day for first week, then 1 spray on each side.   Instructions for use: If you also use a saline nasal spray or rinse, use that first. Position the head with the chin slightly tucked. Use the right hand to spray into the left nostril and the right hand to spray into the left nostril.   Point the bottle away from the septum of your nose (cartilage that divides the two sides of your nose).  Hold the nostril closed on the opposite side from where you will spray Spray once and gently sniff to pull the medicine into the higher parts of your nose.  Don't sniff too hard as the medicine will drain down the back of your throat instead. Repeat with a second spray on the same side if prescribed. Repeat on the other side of your nose.

## 2024-01-15 DIAGNOSIS — Z860101 Personal history of adenomatous and serrated colon polyps: Secondary | ICD-10-CM | POA: Diagnosis not present

## 2024-01-15 DIAGNOSIS — D120 Benign neoplasm of cecum: Secondary | ICD-10-CM | POA: Diagnosis not present

## 2024-01-15 DIAGNOSIS — K626 Ulcer of anus and rectum: Secondary | ICD-10-CM | POA: Diagnosis not present

## 2024-01-15 DIAGNOSIS — Z09 Encounter for follow-up examination after completed treatment for conditions other than malignant neoplasm: Secondary | ICD-10-CM | POA: Diagnosis not present

## 2024-01-15 DIAGNOSIS — D124 Benign neoplasm of descending colon: Secondary | ICD-10-CM | POA: Diagnosis not present

## 2024-01-15 DIAGNOSIS — K6289 Other specified diseases of anus and rectum: Secondary | ICD-10-CM | POA: Diagnosis not present

## 2024-01-15 DIAGNOSIS — D123 Benign neoplasm of transverse colon: Secondary | ICD-10-CM | POA: Diagnosis not present

## 2024-01-15 DIAGNOSIS — D122 Benign neoplasm of ascending colon: Secondary | ICD-10-CM | POA: Diagnosis not present

## 2024-01-15 DIAGNOSIS — K648 Other hemorrhoids: Secondary | ICD-10-CM | POA: Diagnosis not present

## 2024-01-22 ENCOUNTER — Encounter: Payer: Self-pay | Admitting: Internal Medicine

## 2024-01-22 DIAGNOSIS — D122 Benign neoplasm of ascending colon: Secondary | ICD-10-CM | POA: Diagnosis not present

## 2024-01-22 DIAGNOSIS — D123 Benign neoplasm of transverse colon: Secondary | ICD-10-CM | POA: Diagnosis not present

## 2024-01-22 DIAGNOSIS — D124 Benign neoplasm of descending colon: Secondary | ICD-10-CM | POA: Diagnosis not present

## 2024-01-22 DIAGNOSIS — D120 Benign neoplasm of cecum: Secondary | ICD-10-CM | POA: Diagnosis not present

## 2024-01-22 MED ORDER — MONTELUKAST SODIUM 10 MG PO TABS: 10.0000 mg | ORAL_TABLET | Freq: Every day | ORAL | 5 refills | Status: AC

## 2024-02-01 DIAGNOSIS — D2261 Melanocytic nevi of right upper limb, including shoulder: Secondary | ICD-10-CM | POA: Diagnosis not present

## 2024-02-01 DIAGNOSIS — D2272 Melanocytic nevi of left lower limb, including hip: Secondary | ICD-10-CM | POA: Diagnosis not present

## 2024-02-01 DIAGNOSIS — L570 Actinic keratosis: Secondary | ICD-10-CM | POA: Diagnosis not present

## 2024-02-01 DIAGNOSIS — Z85828 Personal history of other malignant neoplasm of skin: Secondary | ICD-10-CM | POA: Diagnosis not present

## 2024-02-01 DIAGNOSIS — L821 Other seborrheic keratosis: Secondary | ICD-10-CM | POA: Diagnosis not present

## 2024-02-01 DIAGNOSIS — L820 Inflamed seborrheic keratosis: Secondary | ICD-10-CM | POA: Diagnosis not present

## 2024-02-01 DIAGNOSIS — D2262 Melanocytic nevi of left upper limb, including shoulder: Secondary | ICD-10-CM | POA: Diagnosis not present

## 2024-02-01 DIAGNOSIS — L814 Other melanin hyperpigmentation: Secondary | ICD-10-CM | POA: Diagnosis not present

## 2024-02-01 DIAGNOSIS — L905 Scar conditions and fibrosis of skin: Secondary | ICD-10-CM | POA: Diagnosis not present

## 2024-02-03 DIAGNOSIS — H31012 Macula scars of posterior pole (postinflammatory) (post-traumatic), left eye: Secondary | ICD-10-CM | POA: Diagnosis not present

## 2024-02-03 DIAGNOSIS — Z961 Presence of intraocular lens: Secondary | ICD-10-CM | POA: Diagnosis not present

## 2024-04-22 ENCOUNTER — Encounter (HOSPITAL_BASED_OUTPATIENT_CLINIC_OR_DEPARTMENT_OTHER): Payer: Self-pay | Admitting: Orthopaedic Surgery

## 2024-04-22 ENCOUNTER — Other Ambulatory Visit: Payer: Self-pay

## 2024-04-27 ENCOUNTER — Encounter (HOSPITAL_BASED_OUTPATIENT_CLINIC_OR_DEPARTMENT_OTHER): Admission: RE | Payer: Self-pay | Source: Home / Self Care

## 2024-04-27 ENCOUNTER — Ambulatory Visit (HOSPITAL_BASED_OUTPATIENT_CLINIC_OR_DEPARTMENT_OTHER): Admission: RE | Admit: 2024-04-27 | Source: Home / Self Care | Admitting: Orthopaedic Surgery

## 2024-04-27 HISTORY — DX: Hypothyroidism, unspecified: E03.9

## 2024-04-27 HISTORY — DX: Hyperlipidemia, unspecified: E78.5

## 2024-06-27 ENCOUNTER — Ambulatory Visit
